# Patient Record
Sex: Female | Born: 1940 | Race: Black or African American | Hispanic: No | Marital: Married | Smoking: Former smoker
Health system: Southern US, Community
[De-identification: ages and names within clinical notes are randomized; demographics above are authoritative.]

## PROBLEM LIST (undated history)

## (undated) DIAGNOSIS — N1831 Chronic kidney disease, stage 3a: Secondary | ICD-10-CM

## (undated) DIAGNOSIS — I4891 Unspecified atrial fibrillation: Secondary | ICD-10-CM

## (undated) DIAGNOSIS — Z972 Presence of dental prosthetic device (complete) (partial): Secondary | ICD-10-CM

## (undated) DIAGNOSIS — I471 Supraventricular tachycardia, unspecified: Secondary | ICD-10-CM

## (undated) DIAGNOSIS — I340 Nonrheumatic mitral (valve) insufficiency: Secondary | ICD-10-CM

## (undated) DIAGNOSIS — M1990 Unspecified osteoarthritis, unspecified site: Secondary | ICD-10-CM

## (undated) DIAGNOSIS — K219 Gastro-esophageal reflux disease without esophagitis: Secondary | ICD-10-CM

## (undated) DIAGNOSIS — I739 Peripheral vascular disease, unspecified: Secondary | ICD-10-CM

## (undated) DIAGNOSIS — I10 Essential (primary) hypertension: Secondary | ICD-10-CM

## (undated) DIAGNOSIS — R011 Cardiac murmur, unspecified: Secondary | ICD-10-CM

## (undated) DIAGNOSIS — M8580 Other specified disorders of bone density and structure, unspecified site: Secondary | ICD-10-CM

## (undated) DIAGNOSIS — H409 Unspecified glaucoma: Secondary | ICD-10-CM

## (undated) HISTORY — PX: CARDIAC CATHETERIZATION: SHX172

---

## 2004-09-30 ENCOUNTER — Ambulatory Visit: Payer: Self-pay | Admitting: Family Medicine

## 2004-10-14 ENCOUNTER — Ambulatory Visit: Payer: Self-pay | Admitting: Internal Medicine

## 2005-10-27 ENCOUNTER — Ambulatory Visit: Payer: Self-pay | Admitting: General Practice

## 2006-01-14 ENCOUNTER — Ambulatory Visit: Payer: Self-pay | Admitting: Family Medicine

## 2006-03-06 ENCOUNTER — Other Ambulatory Visit: Payer: Self-pay

## 2006-03-06 ENCOUNTER — Emergency Department: Payer: Self-pay | Admitting: Emergency Medicine

## 2006-03-30 ENCOUNTER — Ambulatory Visit: Payer: Self-pay | Admitting: Family Medicine

## 2007-05-03 ENCOUNTER — Ambulatory Visit: Payer: Self-pay | Admitting: Family Medicine

## 2007-07-08 ENCOUNTER — Ambulatory Visit: Payer: Self-pay | Admitting: Family Medicine

## 2007-11-15 ENCOUNTER — Ambulatory Visit: Payer: Self-pay | Admitting: Gastroenterology

## 2008-09-17 ENCOUNTER — Ambulatory Visit: Payer: Self-pay | Admitting: Internal Medicine

## 2008-10-10 ENCOUNTER — Ambulatory Visit: Payer: Self-pay | Admitting: Internal Medicine

## 2009-02-20 ENCOUNTER — Ambulatory Visit: Payer: Self-pay | Admitting: Family Medicine

## 2009-05-05 ENCOUNTER — Ambulatory Visit: Payer: Self-pay

## 2009-05-12 ENCOUNTER — Ambulatory Visit: Payer: Self-pay | Admitting: Pain Medicine

## 2009-05-27 ENCOUNTER — Ambulatory Visit: Payer: Self-pay | Admitting: Pain Medicine

## 2009-06-10 ENCOUNTER — Ambulatory Visit: Payer: Self-pay | Admitting: Physician Assistant

## 2009-06-24 ENCOUNTER — Ambulatory Visit: Payer: Self-pay | Admitting: Pain Medicine

## 2010-09-02 ENCOUNTER — Encounter: Admission: RE | Admit: 2010-09-02 | Discharge: 2010-09-02 | Payer: Self-pay | Admitting: Neurosurgery

## 2010-10-28 ENCOUNTER — Ambulatory Visit: Payer: Self-pay | Admitting: Family Medicine

## 2011-12-15 ENCOUNTER — Ambulatory Visit: Payer: Self-pay | Admitting: Gastroenterology

## 2012-06-07 ENCOUNTER — Telehealth: Payer: Self-pay

## 2012-06-07 ENCOUNTER — Other Ambulatory Visit: Payer: Self-pay

## 2012-06-07 DIAGNOSIS — R109 Unspecified abdominal pain: Secondary | ICD-10-CM

## 2012-06-07 NOTE — Telephone Encounter (Signed)
Pt has been scheduled for an EUS and needs to be instructed and meds reviewed

## 2012-06-07 NOTE — Telephone Encounter (Signed)
Pt has been instructed and meds reviewed pt will call with any questions or concerns 

## 2012-06-21 ENCOUNTER — Telehealth: Payer: Self-pay

## 2012-06-21 NOTE — Telephone Encounter (Signed)
Pt has been reinstructed and meds reviewed for new appt date and time

## 2012-06-21 NOTE — Telephone Encounter (Signed)
eus changed to 07/13/12 1030 am

## 2012-07-12 DIAGNOSIS — G8929 Other chronic pain: Secondary | ICD-10-CM | POA: Insufficient documentation

## 2012-07-12 DIAGNOSIS — K219 Gastro-esophageal reflux disease without esophagitis: Secondary | ICD-10-CM | POA: Insufficient documentation

## 2012-07-12 DIAGNOSIS — I10 Essential (primary) hypertension: Secondary | ICD-10-CM | POA: Insufficient documentation

## 2012-07-12 DIAGNOSIS — D649 Anemia, unspecified: Secondary | ICD-10-CM | POA: Insufficient documentation

## 2012-07-13 ENCOUNTER — Encounter (HOSPITAL_COMMUNITY): Payer: Self-pay

## 2012-07-13 ENCOUNTER — Ambulatory Visit (HOSPITAL_COMMUNITY): Payer: Medicare Other | Admitting: Anesthesiology

## 2012-07-13 ENCOUNTER — Encounter (HOSPITAL_COMMUNITY): Payer: Self-pay | Admitting: Anesthesiology

## 2012-07-13 ENCOUNTER — Encounter (HOSPITAL_COMMUNITY)
Admission: RE | Disposition: A | Discharge status: Home / Self Care | Payer: Self-pay | Source: Ambulatory Visit | Attending: Gastroenterology

## 2012-07-13 ENCOUNTER — Ambulatory Visit (HOSPITAL_COMMUNITY)
Admission: RE | Admit: 2012-07-13 | Discharge: 2012-07-13 | Disposition: A | Discharge status: Home / Self Care | Payer: Medicare Other | Source: Ambulatory Visit | Attending: Gastroenterology | Admitting: Gastroenterology

## 2012-07-13 DIAGNOSIS — K8689 Other specified diseases of pancreas: Secondary | ICD-10-CM | POA: Insufficient documentation

## 2012-07-13 DIAGNOSIS — R109 Unspecified abdominal pain: Secondary | ICD-10-CM

## 2012-07-13 DIAGNOSIS — K219 Gastro-esophageal reflux disease without esophagitis: Secondary | ICD-10-CM | POA: Insufficient documentation

## 2012-07-13 DIAGNOSIS — I10 Essential (primary) hypertension: Secondary | ICD-10-CM | POA: Insufficient documentation

## 2012-07-13 DIAGNOSIS — D649 Anemia, unspecified: Secondary | ICD-10-CM | POA: Insufficient documentation

## 2012-07-13 HISTORY — DX: Essential (primary) hypertension: I10

## 2012-07-13 HISTORY — PX: EUS: SHX5427

## 2012-07-13 SURGERY — UPPER ENDOSCOPIC ULTRASOUND (EUS) LINEAR
Anesthesia: Monitor Anesthesia Care

## 2012-07-13 MED ORDER — LACTATED RINGERS IV SOLN
INTRAVENOUS | Status: DC
  Administered 2012-07-13: 10:00:00 via INTRAVENOUS

## 2012-07-13 MED ORDER — FENTANYL CITRATE 0.05 MG/ML IJ SOLN
INTRAMUSCULAR | Status: DC | PRN
  Administered 2012-07-13: 50 ug via INTRAVENOUS

## 2012-07-13 MED ORDER — PROPOFOL 10 MG/ML IV EMUL
INTRAVENOUS | Status: DC | PRN
  Administered 2012-07-13: 140 ug/kg/min via INTRAVENOUS

## 2012-07-13 MED ORDER — MIDAZOLAM HCL 5 MG/5ML IJ SOLN
INTRAMUSCULAR | Status: DC | PRN
  Administered 2012-07-13 (×2): 1 mg via INTRAVENOUS

## 2012-07-13 MED ORDER — SODIUM CHLORIDE 0.9 % IV SOLN: INTRAVENOUS | Status: DC

## 2012-07-13 MED ORDER — KETAMINE HCL 10 MG/ML IJ SOLN
INTRAMUSCULAR | Status: DC | PRN
  Administered 2012-07-13: 16 mg via INTRAVENOUS

## 2012-07-13 MED ORDER — ONDANSETRON HCL 4 MG/2ML IJ SOLN
INTRAMUSCULAR | Status: DC | PRN
  Administered 2012-07-13: 4 mg via INTRAVENOUS

## 2012-07-13 MED ORDER — BUTAMBEN-TETRACAINE-BENZOCAINE 2-2-14 % EX AERO
INHALATION_SPRAY | CUTANEOUS | Status: DC | PRN
  Administered 2012-07-13: 2 via TOPICAL

## 2012-07-13 NOTE — Transfer of Care (Signed)
Immediate Anesthesia Transfer of Care Note  Patient: Margaret Cannon  Procedure(s) Performed: Procedure(s) (LRB): UPPER ENDOSCOPIC ULTRASOUND (EUS) LINEAR (N/A)  Patient Location: PACU  Anesthesia Type: MAC  Level of Consciousness: sedated, patient cooperative and responds to stimulaton  Airway & Oxygen Therapy: Patient Spontanous Breathing and Patient connected to face mask oxgen  Post-op Assessment: Report given to PACU RN and Post -op Vital signs reviewed and stable  Post vital signs: Reviewed and stable  Complications: No apparent anesthesia complications

## 2012-07-13 NOTE — Preoperative (Signed)
Beta Blockers   Reason not to administer Beta Blockers:Not Applicable 

## 2012-07-13 NOTE — Anesthesia Postprocedure Evaluation (Signed)
Anesthesia Post Note  Patient: Margaret Cannon  Procedure(s) Performed: Procedure(s) (LRB): UPPER ENDOSCOPIC ULTRASOUND (EUS) LINEAR (N/A)  Anesthesia type: MAC  Patient location: PACU  Post pain: Pain level controlled  Post assessment: Post-op Vital signs reviewed  Last Vitals: BP 122/70  Pulse 82  Temp 36.8 C (Oral)  Resp 23  Ht 5\' 3"  (1.6 m)  Wt 211 lb (95.709 kg)  BMI 37.38 kg/m2  SpO2 99%  Post vital signs: Reviewed  Level of consciousness: awake  Complications: No apparent anesthesia complications

## 2012-07-13 NOTE — Op Note (Signed)
[facility] [address] [place], [address]   ENDOSCOPIC ULTRASOUND PROCEDURE REPORT  PATIENT: Margaret Cannon, Margaret Cannon  MR#: [id number] BIRTHDATE: 1941/10/01  GENDER: Female ENDOSCOPIST: Rachael Fee, MD REFERRED BY:  Lutricia Feil, MD at [facility] PROCEDURE DATE:  07/13/2012 PROCEDURE:   Upper EUS ASA CLASS:      Class III INDICATIONS:   abdominal pain; CT scan recently without clear etiology.  EGD wtih Dr.  Bluford Kaufmann 4-5 months ago + H.  pylori, treated; never had pancreatic illness, stable weight, no etoh abuse, no pancreatic disease in family. MEDICATIONS: MAC sedation, administered by CRNA  DESCRIPTION OF PROCEDURE:   After the risks benefits and alternatives of the procedure were  explained, informed consent was obtained. The patient was then placed in the left, lateral, decubitus postion and IV sedation was administered. Throughout the procedure, the patients blood pressure, pulse and oxygen saturations were monitored continuously.  Under direct visualization, the Pentax EUS Radial [id number]  endoscope was introduced through the mouth  and advanced to the second portion of the duodenum .  Water was used as necessary to provide an acoustic interface.  Upon completion of the imaging, water was removed and the patient was sent to the recovery room in satisfactory condition.   Endoscopic findings: 1. Normal UGI tract  EUS findings: 1. Pancreatic parenchyma was atrophic but otherwise normal; there were no discrete masses and no signs of chronic pancreatitis 2. CBD was normal, non-dilated and no stones 3. Main pancreatic duct was normal; non-dilated 4. Gallbladder was normal 5. No peripancreatic adenopathy 6. Limited views of liver, spleen, portal and splenic vessels were all normal  Impression: Atrophic pancreas but otherwise the examination was normal.    _______________________________ eSigned:  Rachael Fee, MD 07/13/2012 11:30 AM

## 2012-07-13 NOTE — Anesthesia Preprocedure Evaluation (Addendum)
Anesthesia Evaluation  Patient identified by MRN, date of birth, ID band Patient awake    Reviewed: Allergy & Precautions, H&P , NPO status , Patient's Chart, lab work & pertinent test results  Airway Mallampati: II TM Distance: >3 FB Neck ROM: Full    Dental  (+) Dental Advisory Given, Edentulous Upper and Edentulous Lower   Pulmonary former smoker,  breath sounds clear to auscultation  Pulmonary exam normal       Cardiovascular hypertension, Pt. on medications - CAD, - Past MI and - CHF Rhythm:Regular Rate:Normal     Neuro/Psych negative neurological ROS  negative psych ROS   GI/Hepatic Neg liver ROS, GERD-  Medicated,  Endo/Other  negative endocrine ROS  Renal/GU Renal disease     Musculoskeletal negative musculoskeletal ROS (+)   Abdominal   Peds  Hematology negative hematology ROS (+) Blood dyscrasia, anemia ,   Anesthesia Other Findings   Reproductive/Obstetrics                         Anesthesia Physical Anesthesia Plan  ASA: II  Anesthesia Plan: MAC   Post-op Pain Management:    Induction: Intravenous  Airway Management Planned: Simple Face Mask  Additional Equipment:   Intra-op Plan:   Post-operative Plan:   Informed Consent: I have reviewed the patients History and Physical, chart, labs and discussed the procedure including the risks, benefits and alternatives for the proposed anesthesia with the patient or authorized representative who has indicated his/her understanding and acceptance.   Dental advisory given  Plan Discussed with: CRNA  Anesthesia Plan Comments:        Anesthesia Quick Evaluation

## 2012-07-13 NOTE — H&P (Signed)
  HPI: This is a woman with chronic abd pain (normal LFTs, essentially normal CT) sent here for evaluation of pancreas    Past Medical History  Diagnosis Date  . Hypertension     History reviewed. No pertinent past surgical history.  Current Facility-Administered Medications  Medication Dose Route Frequency Provider Last Rate Last Dose  . 0.9 %  sodium chloride infusion   Intravenous Continuous Rachael Fee, MD      . lactated ringers infusion   Intravenous Continuous Rachael Fee, MD 20 mL/hr at 07/13/12 1610      Allergies as of 06/07/2012  . (No Known Allergies)    History reviewed. No pertinent family history.  History   Social History  . Marital Status: Married    Spouse Name: N/A    Number of Children: N/A  . Years of Education: N/A   Occupational History  . Not on file.   Social History Main Topics  . Smoking status: Former Smoker    Quit date: 07/13/2009  . Smokeless tobacco: Not on file  . Alcohol Use: No  . Drug Use: No  . Sexually Active:    Other Topics Concern  . Not on file   Social History Narrative  . No narrative on file      Physical Exam: BP 141/80  Pulse 82  Temp 98.2 F (36.8 C) (Oral)  Resp 20  Ht 5\' 3"  (1.6 m)  Wt 211 lb (95.709 kg)  BMI 37.38 kg/m2  SpO2 98% Constitutional: generally well-appearing Psychiatric: alert and oriented x3 Abdomen: soft, nontender, nondistended, no obvious ascites, no peritoneal signs, normal bowel sounds     Assessment and plan: 71 y.o. female with abdomnainl pain  For EUS today

## 2012-07-17 ENCOUNTER — Encounter (HOSPITAL_COMMUNITY): Payer: Self-pay | Admitting: Gastroenterology

## 2012-08-29 ENCOUNTER — Ambulatory Visit: Payer: Self-pay | Admitting: Family Medicine

## 2013-10-15 ENCOUNTER — Ambulatory Visit: Payer: Self-pay | Admitting: Family Medicine

## 2014-01-11 DIAGNOSIS — H40002 Preglaucoma, unspecified, left eye: Secondary | ICD-10-CM | POA: Insufficient documentation

## 2014-01-13 DIAGNOSIS — B360 Pityriasis versicolor: Secondary | ICD-10-CM | POA: Insufficient documentation

## 2014-07-30 ENCOUNTER — Ambulatory Visit: Payer: Self-pay | Admitting: Family Medicine

## 2014-08-13 DIAGNOSIS — M8580 Other specified disorders of bone density and structure, unspecified site: Secondary | ICD-10-CM | POA: Insufficient documentation

## 2014-10-18 ENCOUNTER — Ambulatory Visit: Payer: Self-pay | Admitting: Family Medicine

## 2016-06-29 DIAGNOSIS — I87009 Postthrombotic syndrome without complications of unspecified extremity: Secondary | ICD-10-CM | POA: Insufficient documentation

## 2016-06-29 DIAGNOSIS — I878 Other specified disorders of veins: Secondary | ICD-10-CM | POA: Insufficient documentation

## 2016-07-30 ENCOUNTER — Other Ambulatory Visit: Payer: Self-pay | Admitting: Family Medicine

## 2016-07-30 DIAGNOSIS — Z1231 Encounter for screening mammogram for malignant neoplasm of breast: Secondary | ICD-10-CM

## 2016-09-03 ENCOUNTER — Ambulatory Visit
Admission: RE | Admit: 2016-09-03 | Discharge: 2016-09-03 | Disposition: A | Discharge status: Home / Self Care | Payer: Medicare Other | Source: Ambulatory Visit | Attending: Family Medicine | Admitting: Family Medicine

## 2016-09-03 DIAGNOSIS — Z1231 Encounter for screening mammogram for malignant neoplasm of breast: Secondary | ICD-10-CM | POA: Diagnosis not present

## 2017-08-29 ENCOUNTER — Other Ambulatory Visit: Payer: Self-pay | Admitting: Family Medicine

## 2017-08-29 DIAGNOSIS — Z1231 Encounter for screening mammogram for malignant neoplasm of breast: Secondary | ICD-10-CM

## 2017-09-16 ENCOUNTER — Ambulatory Visit
Admission: RE | Admit: 2017-09-16 | Discharge: 2017-09-16 | Disposition: A | Discharge status: Home / Self Care | Payer: Medicare Other | Source: Ambulatory Visit | Attending: Family Medicine | Admitting: Family Medicine

## 2017-09-16 DIAGNOSIS — Z1231 Encounter for screening mammogram for malignant neoplasm of breast: Secondary | ICD-10-CM | POA: Diagnosis present

## 2018-07-04 ENCOUNTER — Encounter: Payer: Self-pay | Admitting: *Deleted

## 2018-07-05 ENCOUNTER — Ambulatory Visit
Admission: RE | Admit: 2018-07-05 | Discharge: 2018-07-05 | Disposition: A | Discharge status: Home / Self Care | Payer: Medicare Other | Source: Ambulatory Visit | Attending: Internal Medicine | Admitting: Internal Medicine

## 2018-07-05 ENCOUNTER — Ambulatory Visit: Payer: Medicare Other | Admitting: Anesthesiology

## 2018-07-05 ENCOUNTER — Other Ambulatory Visit: Payer: Self-pay

## 2018-07-05 ENCOUNTER — Encounter
Admission: RE | Disposition: A | Discharge status: Home / Self Care | Payer: Self-pay | Source: Ambulatory Visit | Attending: Internal Medicine

## 2018-07-05 DIAGNOSIS — K5730 Diverticulosis of large intestine without perforation or abscess without bleeding: Secondary | ICD-10-CM | POA: Insufficient documentation

## 2018-07-05 DIAGNOSIS — H409 Unspecified glaucoma: Secondary | ICD-10-CM | POA: Insufficient documentation

## 2018-07-05 DIAGNOSIS — D123 Benign neoplasm of transverse colon: Secondary | ICD-10-CM | POA: Diagnosis not present

## 2018-07-05 DIAGNOSIS — K449 Diaphragmatic hernia without obstruction or gangrene: Secondary | ICD-10-CM | POA: Insufficient documentation

## 2018-07-05 DIAGNOSIS — M8580 Other specified disorders of bone density and structure, unspecified site: Secondary | ICD-10-CM | POA: Insufficient documentation

## 2018-07-05 DIAGNOSIS — K228 Other specified diseases of esophagus: Secondary | ICD-10-CM | POA: Insufficient documentation

## 2018-07-05 DIAGNOSIS — K640 First degree hemorrhoids: Secondary | ICD-10-CM | POA: Insufficient documentation

## 2018-07-05 DIAGNOSIS — K219 Gastro-esophageal reflux disease without esophagitis: Secondary | ICD-10-CM | POA: Insufficient documentation

## 2018-07-05 DIAGNOSIS — Z86010 Personal history of colonic polyps: Secondary | ICD-10-CM | POA: Insufficient documentation

## 2018-07-05 DIAGNOSIS — Z1211 Encounter for screening for malignant neoplasm of colon: Secondary | ICD-10-CM | POA: Diagnosis not present

## 2018-07-05 DIAGNOSIS — Z87891 Personal history of nicotine dependence: Secondary | ICD-10-CM | POA: Diagnosis not present

## 2018-07-05 DIAGNOSIS — I10 Essential (primary) hypertension: Secondary | ICD-10-CM | POA: Insufficient documentation

## 2018-07-05 HISTORY — DX: Other specified disorders of bone density and structure, unspecified site: M85.80

## 2018-07-05 HISTORY — DX: Gastro-esophageal reflux disease without esophagitis: K21.9

## 2018-07-05 HISTORY — PX: ESOPHAGOGASTRODUODENOSCOPY (EGD) WITH PROPOFOL: SHX5813

## 2018-07-05 HISTORY — PX: COLONOSCOPY WITH PROPOFOL: SHX5780

## 2018-07-05 HISTORY — DX: Unspecified glaucoma: H40.9

## 2018-07-05 HISTORY — DX: Cardiac murmur, unspecified: R01.1

## 2018-07-05 SURGERY — COLONOSCOPY WITH PROPOFOL
Anesthesia: General

## 2018-07-05 MED ORDER — PROPOFOL 500 MG/50ML IV EMUL
INTRAVENOUS | Status: AC
  Filled 2018-07-05: qty 50

## 2018-07-05 MED ORDER — PROPOFOL 10 MG/ML IV BOLUS
INTRAVENOUS | Status: DC | PRN
  Administered 2018-07-05: 100 mg via INTRAVENOUS

## 2018-07-05 MED ORDER — LIDOCAINE HCL (CARDIAC) PF 100 MG/5ML IV SOSY
PREFILLED_SYRINGE | INTRAVENOUS | Status: DC | PRN
  Administered 2018-07-05: 100 mg via INTRAVENOUS

## 2018-07-05 MED ORDER — PROPOFOL 500 MG/50ML IV EMUL
INTRAVENOUS | Status: DC | PRN
  Administered 2018-07-05: 150 ug/kg/min via INTRAVENOUS

## 2018-07-05 MED ORDER — GLYCOPYRROLATE 0.2 MG/ML IJ SOLN
INTRAMUSCULAR | Status: AC
  Filled 2018-07-05: qty 1

## 2018-07-05 MED ORDER — GLYCOPYRROLATE 0.2 MG/ML IJ SOLN
INTRAMUSCULAR | Status: DC | PRN
  Administered 2018-07-05: 0.2 mg via INTRAVENOUS

## 2018-07-05 MED ORDER — SODIUM CHLORIDE 0.9 % IV SOLN
INTRAVENOUS | Status: DC
  Administered 2018-07-05: 08:00:00 via INTRAVENOUS

## 2018-07-05 MED ORDER — LIDOCAINE HCL (PF) 2 % IJ SOLN
INTRAMUSCULAR | Status: AC
  Filled 2018-07-05: qty 10

## 2018-07-05 NOTE — Anesthesia Preprocedure Evaluation (Signed)
Anesthesia Evaluation  Patient identified by MRN, date of birth, ID band Patient awake    Reviewed: Allergy & Precautions, H&P , NPO status , Patient's Chart, lab work & pertinent test results, reviewed documented beta blocker date and time   History of Anesthesia Complications Negative for: history of anesthetic complications  Airway Mallampati: II  TM Distance: >3 FB Neck ROM: full    Dental  (+) Lower Dentures, Upper Dentures, Edentulous Upper, Edentulous Lower, Dental Advidsory Given   Pulmonary neg pulmonary ROS, former smoker,           Cardiovascular Exercise Tolerance: Good hypertension, (-) angina(-) CAD, (-) Past MI, (-) Cardiac Stents and (-) CABG (-) dysrhythmias + Valvular Problems/Murmurs      Neuro/Psych negative neurological ROS  negative psych ROS   GI/Hepatic negative GI ROS, Neg liver ROS,   Endo/Other  negative endocrine ROS  Renal/GU negative Renal ROS  negative genitourinary   Musculoskeletal   Abdominal   Peds  Hematology negative hematology ROS (+)   Anesthesia Other Findings Past Medical History: No date: GERD (gastroesophageal reflux disease) No date: Glaucoma     Comment:  left eye No date: Heart murmur No date: Hypertension No date: Osteopenia   Reproductive/Obstetrics negative OB ROS                             Anesthesia Physical Anesthesia Plan  ASA: II  Anesthesia Plan: General   Post-op Pain Management:    Induction: Intravenous  PONV Risk Score and Plan: 3 and Propofol infusion and TIVA  Airway Management Planned: Natural Airway and Nasal Cannula  Additional Equipment:   Intra-op Plan:   Post-operative Plan:   Informed Consent: I have reviewed the patients History and Physical, chart, labs and discussed the procedure including the risks, benefits and alternatives for the proposed anesthesia with the patient or authorized representative  who has indicated his/her understanding and acceptance.   Dental Advisory Given  Plan Discussed with: Anesthesiologist, CRNA and Surgeon  Anesthesia Plan Comments:         Anesthesia Quick Evaluation

## 2018-07-05 NOTE — H&P (Signed)
Outpatient short stay form Pre-procedure 07/05/2018 8:16 AM Margaret Cannon K. Margaret Cannon, M.D.  Primary Physician: Margaret Holmes, MD  Reason for visit: Dysphagia, personal history of colon polyps, GERD  History of present illness: Pleasant 77 year old female presents for intermittent dysphagia over the past several months with solid food dysphagia to the level sternal notch.  Has acid reflux that has only minimally responded to over-the-counter antacids.  Denies any rectal bleeding change in bowel habits or abdominal pain.    Current Facility-Administered Medications:  .  0.9 %  sodium chloride infusion, , Intravenous, Continuous, Margaret Cannon, Margaret Pike, MD  Medications Prior to Admission  Medication Sig Dispense Refill Last Dose  . HYDROcodone-acetaminophen (NORCO/VICODIN) 5-325 MG per tablet Take 1 tablet by mouth every 6 (six) hours as needed.   Past Week at Unknown time  . pantoprazole (PROTONIX) 40 MG tablet Take 40 mg by mouth daily.   Past Week at Unknown time  . triamterene-hydrochlorothiazide (DYAZIDE) 37.5-25 MG per capsule Take 1 capsule by mouth every morning.   07/05/2018 at 0600     No Known Allergies   Past Medical History:  Diagnosis Date  . GERD (gastroesophageal reflux disease)   . Glaucoma    left eye  . Heart murmur   . Hypertension   . Osteopenia     Review of systems:  Otherwise negative.    Physical Exam  Gen: Alert, oriented. Appears stated age.  HEENT: [place]/AT. PERRLA. Lungs: CTA, no wheezes. CV: RR nl S1, S2. Abd: soft, benign, no masses. BS+ Ext: No edema. Pulses 2+    Planned procedures: Proceed with EGD and colonoscopy. The patient understands the nature of the planned procedure, indications, risks, alternatives and potential complications including but not limited to bleeding, infection, perforation, damage to internal organs and possible oversedation/side effects from anesthesia. The patient agrees and gives consent to proceed.  Please refer to procedure  notes for findings, recommendations and patient disposition/instructions.     Margaret Cannon K. Margaret Cannon, M.D. Gastroenterology 07/05/2018  8:16 AM

## 2018-07-05 NOTE — Transfer of Care (Signed)
Immediate Anesthesia Transfer of Care Note  Patient: Margaret Cannon  Procedure(s) Performed: COLONOSCOPY WITH PROPOFOL (N/A ) ESOPHAGOGASTRODUODENOSCOPY (EGD) WITH PROPOFOL (N/A )  Patient Location: Endoscopy Unit  Anesthesia Type:General  Level of Consciousness: sedated  Airway & Oxygen Therapy: Patient Spontanous Breathing and Patient connected to nasal cannula oxygen  Post-op Assessment: Report given to RN and Post -op Vital signs reviewed and stable  Post vital signs: Reviewed and stable  Last Vitals:  Vitals Value Taken Time  BP 102/53 07/05/2018  8:44 AM  Temp 36.1 C 07/05/2018  8:44 AM  Pulse 91 07/05/2018  8:46 AM  Resp 22 07/05/2018  8:46 AM  SpO2 100 % 07/05/2018  8:46 AM  Vitals shown include unvalidated device data.  Last Pain:  Vitals:   07/05/18 0844  TempSrc: Tympanic  PainSc:          Complications: No apparent anesthesia complications

## 2018-07-05 NOTE — Op Note (Signed)
[facility] Gastroenterology Patient Name: Margaret Cannon Procedure Date: 07/05/2018 7:30 AM MRN: [id number] Account #: [id number] Date of Birth: 1941/09/04 Admit Type: Outpatient Age: 77 Room: [facility] ENDO ROOM 4 Gender: Female Note Status: Finalized Procedure:            Upper GI endoscopy Indications:          Dysphagia, Follow-up of esophageal reflux Providers:            Benay Pike. Alice Reichert MD, MD Referring MD:         Rubbie Battiest. Iona Beard MD, MD (Referring MD) Medicines:            Propofol per Anesthesia Complications:        No immediate complications. Procedure:            Pre-Anesthesia Assessment:                       - The risks and benefits of the procedure and the                        sedation options and risks were discussed with the                        patient. All questions were answered and informed                        consent was obtained.                       - Patient identification and proposed procedure were                        verified prior to the procedure by the nurse. The                        procedure was verified in the procedure room.                       - ASA Grade Assessment: III - A patient with severe                        systemic disease.                       - After reviewing the risks and benefits, the patient                        was deemed in satisfactory condition to undergo the                        procedure.                       After obtaining informed consent, the endoscope was                        passed under direct vision. Throughout the procedure,                        the patient's blood pressure, pulse, and oxygen  saturations were monitored continuously. The Endoscope                        was introduced through the mouth, and advanced to the                        third part of duodenum. The upper GI endoscopy was                        accomplished without difficulty.  The patient tolerated                        the procedure well. Findings:      Moderate tortuosity of the mid to distal esophagus was noted compatible       with a diagnosis of Presbyesophagus.      A 5 cm hiatal hernia was present.      The examined duodenum was normal.      The exam was otherwise without abnormality. Impression:           - 5 cm hiatal hernia.                       - Normal examined duodenum.                       - The examination was otherwise normal.                       - No specimens collected. Recommendation:       - Patient has a contact number available for                        emergencies. The signs and symptoms of potential                        delayed complications were discussed with the patient.                        Return to normal activities tomorrow. Written discharge                        instructions were provided to the patient.                       - Resume previous diet.                       - Continue present medications. Procedure Code(s):    --- Professional ---                       [phone], Esophagogastroduodenoscopy, flexible, transoral;                        diagnostic, including collection of specimen(s) by                        brushing or washing, when performed (separate procedure) Diagnosis Code(s):    --- Professional ---                       K21.9, Gastro-esophageal reflux disease without  esophagitis                       R13.10, Dysphagia, unspecified                       K44.9, Diaphragmatic hernia without obstruction or                        gangrene CPT copyright 2017 American Medical Association. All rights reserved. The codes documented in this report are preliminary and upon coder review may  be revised to meet current compliance requirements. Efrain Sella MD, MD 07/05/2018 8:29:12 AM This report has been signed electronically. Number of Addenda: 0 Note Initiated On: 07/05/2018 7:30  AM      [facility]

## 2018-07-05 NOTE — Anesthesia Post-op Follow-up Note (Signed)
Anesthesia QCDR form completed.        

## 2018-07-05 NOTE — Interval H&P Note (Signed)
History and Physical Interval Note:  07/05/2018 8:17 AM  Margaret Cannon  has presented today for surgery, with the diagnosis of PHCP GERD  DYSPHAGIA  The various methods of treatment have been discussed with the patient and family. After consideration of risks, benefits and other options for treatment, the patient has consented to  Procedure(s): COLONOSCOPY WITH PROPOFOL (N/A) ESOPHAGOGASTRODUODENOSCOPY (EGD) WITH PROPOFOL (N/A) as a surgical intervention .  The patient's history has been reviewed, patient examined, no change in status, stable for surgery.  I have reviewed the patient's chart and labs.  Questions were answered to the patient's satisfaction.     [place], [place]

## 2018-07-05 NOTE — Anesthesia Postprocedure Evaluation (Signed)
Anesthesia Post Note  Patient: Margaret Cannon  Procedure(s) Performed: COLONOSCOPY WITH PROPOFOL (N/A ) ESOPHAGOGASTRODUODENOSCOPY (EGD) WITH PROPOFOL (N/A )  Patient location during evaluation: Endoscopy Anesthesia Type: General Level of consciousness: awake and alert Pain management: pain level controlled Vital Signs Assessment: post-procedure vital signs reviewed and stable Respiratory status: spontaneous breathing, nonlabored ventilation, respiratory function stable and patient connected to nasal cannula oxygen Cardiovascular status: blood pressure returned to baseline and stable Postop Assessment: no apparent nausea or vomiting Anesthetic complications: no     Last Vitals:  Vitals:   07/05/18 0844 07/05/18 0904  BP: (!) 102/53 (!) 100/50  Pulse:    Resp:    Temp: (!) 36.1 C     Last Pain:  Vitals:   07/05/18 0904  TempSrc:   PainSc: 0-No pain                 Martha Clan

## 2018-07-05 NOTE — Op Note (Signed)
[facility] Gastroenterology Patient Name: Margaret Cannon Procedure Date: 07/05/2018 7:30 AM MRN: [id number] Account #: [id number] Date of Birth: [birth year] Admit Type: Outpatient Age: 77 Room: [facility] ENDO ROOM 4 Gender: Female Note Status: Finalized Procedure:            Colonoscopy Indications:          High risk colon cancer surveillance: Personal history                        of colonic polyps Providers:            Benay Pike. [place] MD, MD Medicines:            Propofol per Anesthesia Complications:        No immediate complications. Procedure:            Pre-Anesthesia Assessment:                       - The risks and benefits of the procedure and the                        sedation options and risks were discussed with the                        patient. All questions were answered and informed                        consent was obtained.                       - Patient identification and proposed procedure were                        verified prior to the procedure by the nurse. The                        procedure was verified in the procedure room.                       - ASA Grade Assessment: III - A patient with severe                        systemic disease.                       - After reviewing the risks and benefits, the patient                        was deemed in satisfactory condition to undergo the                        procedure.                       After obtaining informed consent, the colonoscope was                        passed under direct vision. Throughout the procedure,                        the patient's blood pressure, pulse, and oxygen  saturations were monitored continuously. The                        Colonoscope was introduced through the anus and                        advanced to the the cecum, identified by appendiceal                        orifice and ileocecal valve. The colonoscopy was                performed without difficulty. The patient tolerated the                        procedure well. The quality of the bowel preparation                        was good. The ileocecal valve, appendiceal orifice, and                        rectum were photographed. Findings:      The perianal and digital rectal examinations were normal. Pertinent       negatives include normal sphincter tone and no palpable rectal lesions.      A 5 mm polyp was found in the transverse colon. The polyp was sessile.       The polyp was removed with a hot snare. Resection and retrieval were       complete.      A few small-mouthed diverticula were found in the transverse colon and       left colon.      Non-bleeding internal hemorrhoids were found during retroflexion. The       hemorrhoids were Grade I (internal hemorrhoids that do not prolapse).      The exam was otherwise without abnormality. Impression:           - One 5 mm polyp in the transverse colon, removed with                        a hot snare. Resected and retrieved.                       - Diverticulosis in the transverse colon and in the                        left colon.                       - Non-bleeding internal hemorrhoids.                       - The examination was otherwise normal. Recommendation:       - Patient has a contact number available for                        emergencies. The signs and symptoms of potential                        delayed complications were discussed with the patient.  Return to normal activities tomorrow. Written discharge                        instructions were provided to the patient.                       - Resume previous diet.                       - Continue present medications.                       - Return to physician assistant in 4 weeks.                       - NO repeat colonoscopy due to age/comorbid status                       - The findings and recommendations  were discussed with                        the patient and their family. Procedure Code(s):    --- Professional ---                       [phone], Colonoscopy, flexible; with removal of tumor(s),                        polyp(s), or other lesion(s) by snare technique Diagnosis Code(s):    --- Professional ---                       K57.30, Diverticulosis of large intestine without                        perforation or abscess without bleeding                       K64.0, First degree hemorrhoids                       D12.3, Benign neoplasm of transverse colon (hepatic                        flexure or splenic flexure)                       Z86.010, Personal history of colonic polyps CPT copyright 2017 American Medical Association. All rights reserved. The codes documented in this report are preliminary and upon coder review may  be revised to meet current compliance requirements. Efrain Sella MD, MD 07/05/2018 8:44:37 AM This report has been signed electronically. Number of Addenda: 0 Note Initiated On: 07/05/2018 7:30 AM Scope Withdrawal Time: 0 hours 6 minutes 0 seconds  Total Procedure Duration: 0 hours 10 minutes 12 seconds       [facility]

## 2018-07-06 ENCOUNTER — Encounter: Payer: Self-pay | Admitting: Internal Medicine

## 2018-07-06 LAB — SURGICAL PATHOLOGY

## 2018-08-10 ENCOUNTER — Other Ambulatory Visit: Payer: Self-pay | Admitting: Family Medicine

## 2018-08-10 DIAGNOSIS — Z1231 Encounter for screening mammogram for malignant neoplasm of breast: Secondary | ICD-10-CM

## 2018-09-18 ENCOUNTER — Ambulatory Visit
Admission: RE | Admit: 2018-09-18 | Discharge: 2018-09-18 | Disposition: A | Discharge status: Home / Self Care | Payer: Medicare Other | Source: Ambulatory Visit | Attending: Family Medicine | Admitting: Family Medicine

## 2018-09-18 DIAGNOSIS — Z1231 Encounter for screening mammogram for malignant neoplasm of breast: Secondary | ICD-10-CM | POA: Insufficient documentation

## 2019-08-27 ENCOUNTER — Other Ambulatory Visit: Payer: Self-pay | Admitting: Family Medicine

## 2019-08-27 DIAGNOSIS — Z1231 Encounter for screening mammogram for malignant neoplasm of breast: Secondary | ICD-10-CM

## 2019-09-16 DIAGNOSIS — Z0000 Encounter for general adult medical examination without abnormal findings: Secondary | ICD-10-CM | POA: Insufficient documentation

## 2019-09-20 ENCOUNTER — Ambulatory Visit
Admission: RE | Admit: 2019-09-20 | Discharge: 2019-09-20 | Disposition: A | Discharge status: Home / Self Care | Payer: Medicare Other | Source: Ambulatory Visit | Attending: Family Medicine | Admitting: Family Medicine

## 2019-09-20 DIAGNOSIS — Z1231 Encounter for screening mammogram for malignant neoplasm of breast: Secondary | ICD-10-CM | POA: Diagnosis not present

## 2019-09-28 IMAGING — MG DIGITAL SCREENING BILATERAL MAMMOGRAM WITH TOMO AND CAD
8 series · 8 of 24 positions shown · non-contrast
Comparison: Previous exam(s).

CLINICAL DATA: Screening.

EXAM:
DIGITAL SCREENING BILATERAL MAMMOGRAM WITH TOMO AND CAD

[L MLO synth-2D]
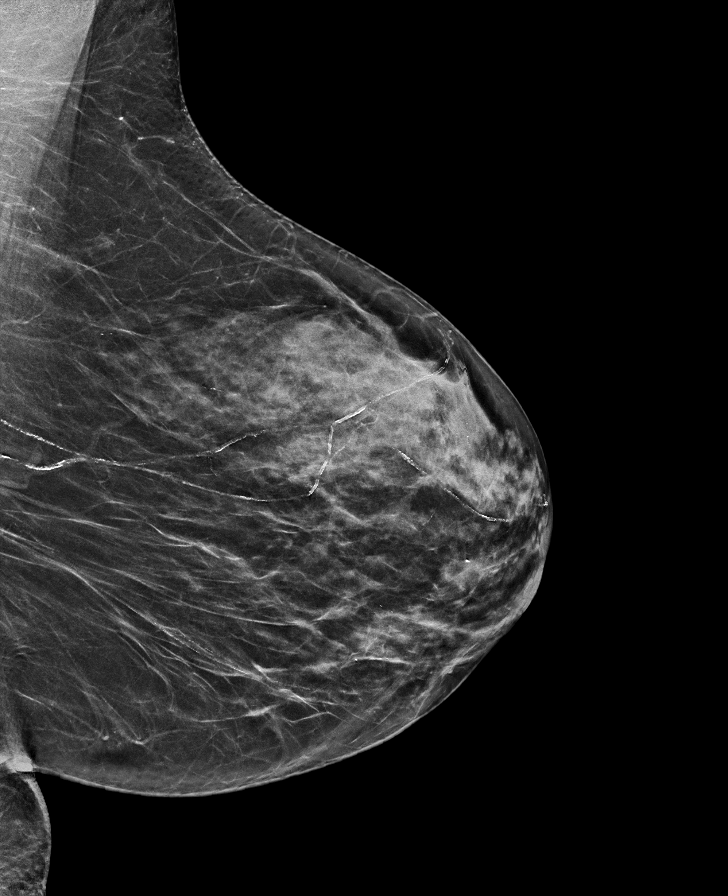

[R MLO synth-2D]
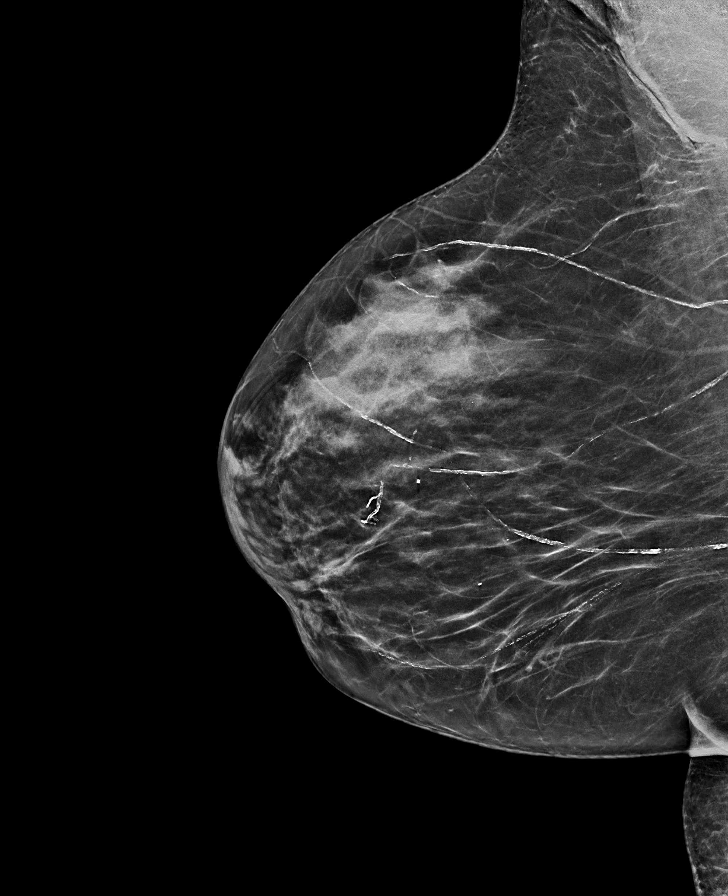

[R CC synth-2D]
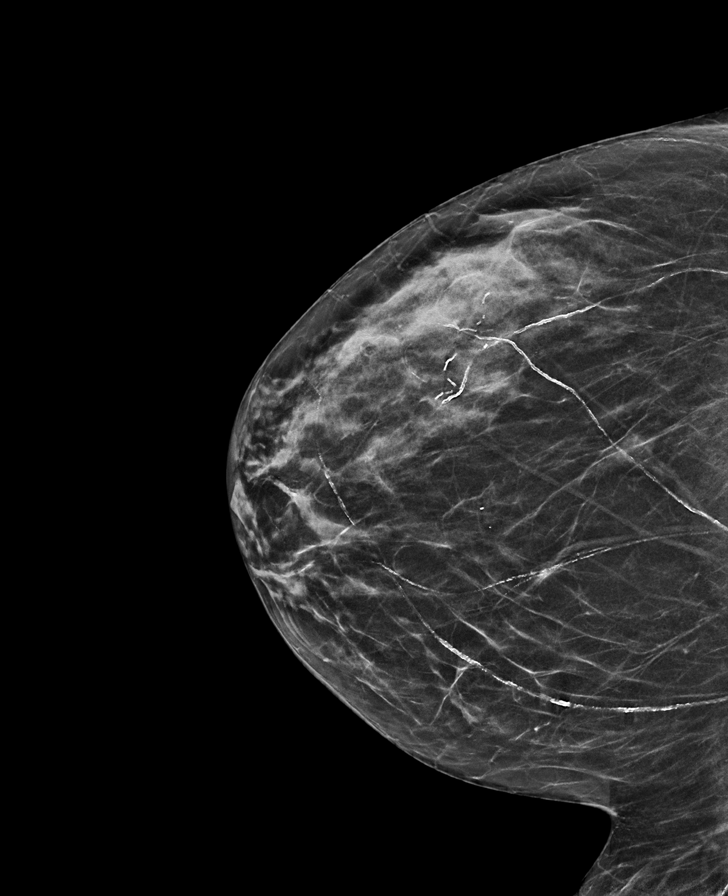

[L CC synth-2D]
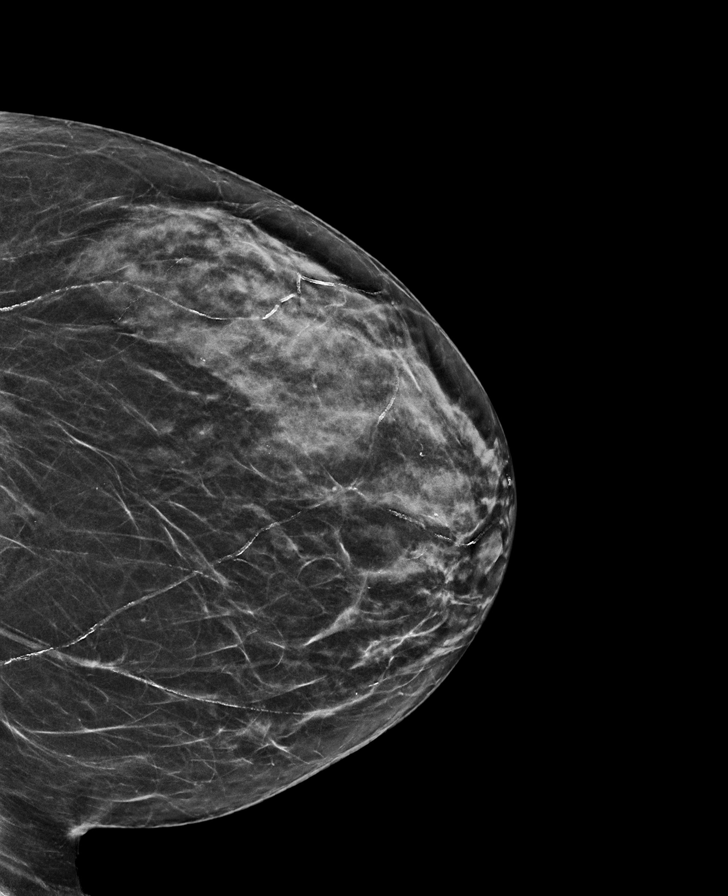

[R MLO tomo · tomo slice 29/56.0]
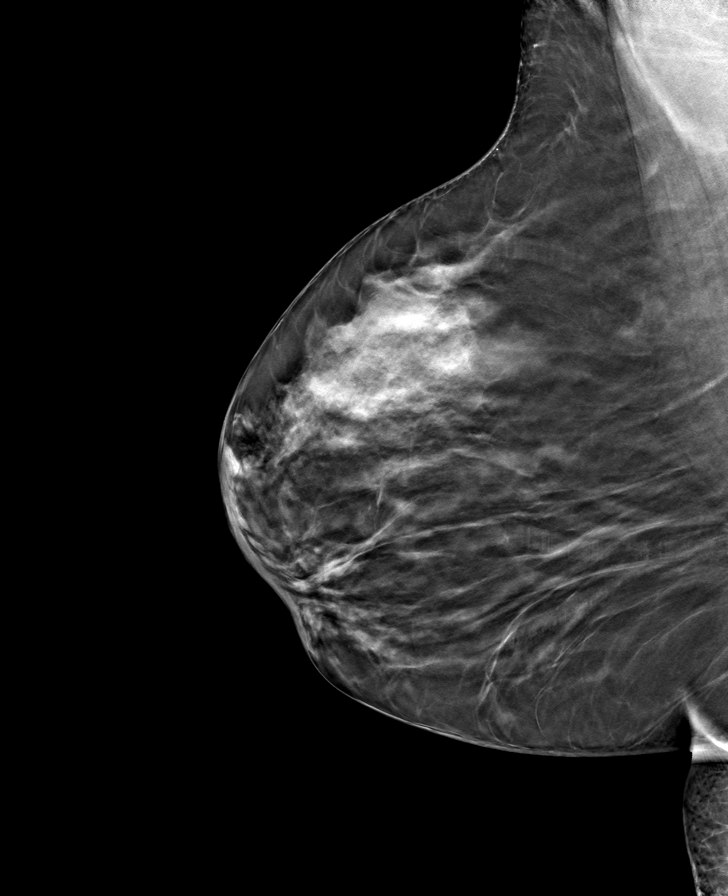

[R CC tomo · tomo slice 27/52.0]
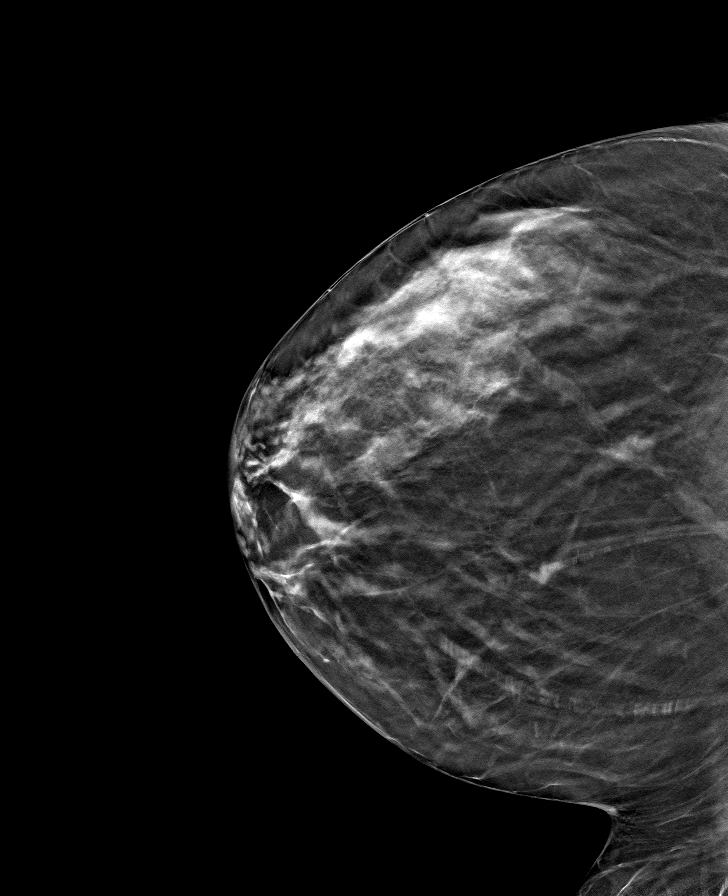

[L MLO tomo · tomo slice 31/60.0]
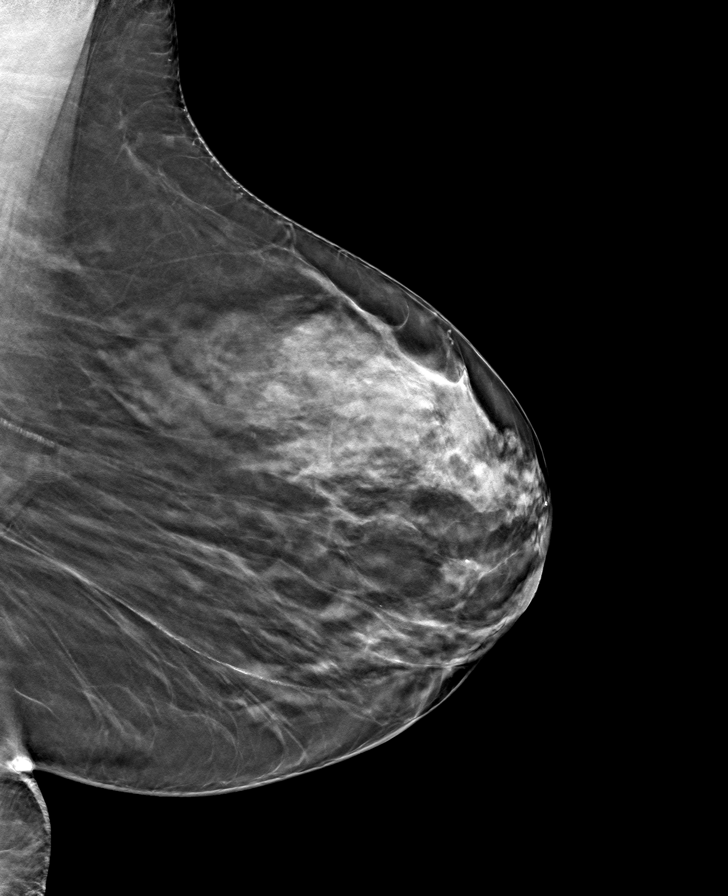

[L CC tomo · tomo slice 27/54.0]
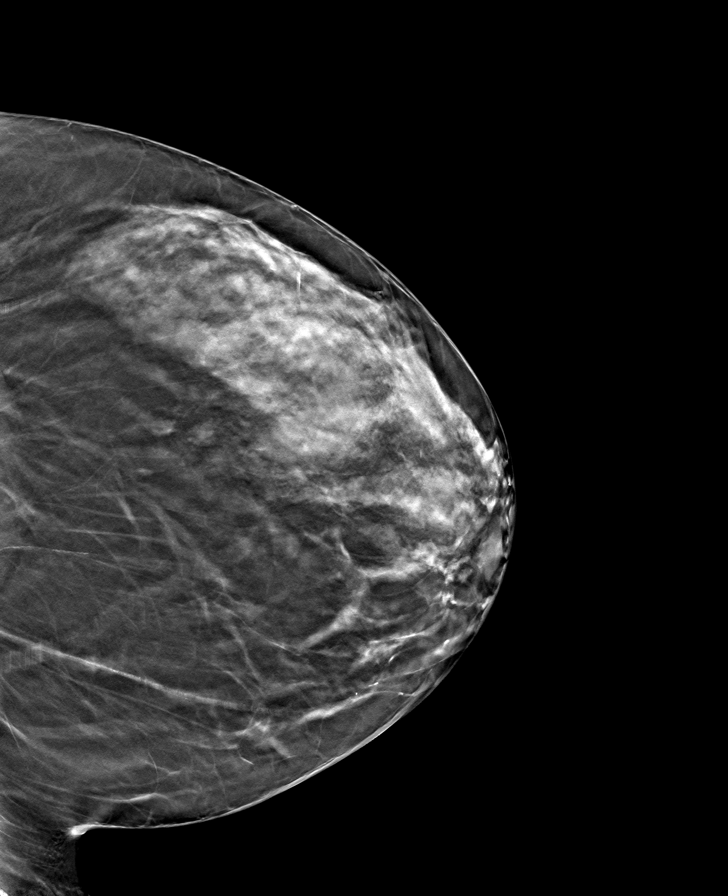

[8 of 24 positions shown; findings below may reference images not displayed]

ACR Breast Density Category c: The breast tissue is heterogeneously
dense, which may obscure small masses.
FINDINGS: There are no findings suspicious for malignancy. Images were
processed with CAD.
IMPRESSION: No mammographic evidence of malignancy. A result letter of this
screening mammogram will be mailed directly to the patient.

RECOMMENDATION:
Screening mammogram in one year. (Code:FT-U-LHB)

BI-RADS CATEGORY  1: Negative.

## 2019-11-05 ENCOUNTER — Other Ambulatory Visit: Payer: Self-pay | Admitting: Family Medicine

## 2019-11-05 DIAGNOSIS — Z780 Asymptomatic menopausal state: Secondary | ICD-10-CM

## 2020-04-16 DIAGNOSIS — L209 Atopic dermatitis, unspecified: Secondary | ICD-10-CM | POA: Insufficient documentation

## 2020-09-03 ENCOUNTER — Other Ambulatory Visit: Payer: Self-pay

## 2020-09-03 ENCOUNTER — Ambulatory Visit: Payer: Medicare Other | Admitting: Podiatry

## 2020-09-03 ENCOUNTER — Encounter: Payer: Self-pay | Admitting: Podiatry

## 2020-09-03 DIAGNOSIS — S90212A Contusion of left great toe with damage to nail, initial encounter: Secondary | ICD-10-CM

## 2020-09-03 DIAGNOSIS — L03032 Cellulitis of left toe: Secondary | ICD-10-CM

## 2020-09-03 DIAGNOSIS — E669 Obesity, unspecified: Secondary | ICD-10-CM | POA: Insufficient documentation

## 2020-09-03 MED ORDER — NEOMYCIN-POLYMYXIN-HC 1 % OT SOLN: OTIC | 1 refills | Status: DC

## 2020-09-03 MED ORDER — DOXYCYCLINE HYCLATE 100 MG PO TABS: 100.0000 mg | ORAL_TABLET | Freq: Two times a day (BID) | ORAL | 0 refills | Status: DC

## 2020-09-03 NOTE — Progress Notes (Signed)
Subjective:  Patient ID: Margaret Cannon, female    DOB: 1941/06/12,  MRN: [id number] HPI Chief Complaint  Patient presents with  . Toe Injury    Hallux left - hit toe on chair x 2 weeks ago, nail is lifted from nailbed, tried trimming it, very sore, wears compression stockings  . New Patient (Initial Visit)    79 y.o. female presents with the above complaint.   ROS: Denies fever chills nausea vomiting muscle aches pains calf pain back pain chest pain shortness of breath.  Past Medical History:  Diagnosis Date  . GERD (gastroesophageal reflux disease)   . Glaucoma    left eye  . Heart murmur   . Hypertension   . Osteopenia    Past Surgical History:  Procedure Laterality Date  . CARDIAC CATHETERIZATION    . COLONOSCOPY WITH PROPOFOL N/A 07/05/2018   Procedure: COLONOSCOPY WITH PROPOFOL;  Surgeon: Toledo, Benay Pike, MD;  Location: [facility]COPY;  Service: Gastroenterology;  Laterality: N/A;  . ESOPHAGOGASTRODUODENOSCOPY (EGD) WITH PROPOFOL N/A 07/05/2018   Procedure: ESOPHAGOGASTRODUODENOSCOPY (EGD) WITH PROPOFOL;  Surgeon: Toledo, Benay Pike, MD;  Location: [facility] ENDOSCOPY;  Service: Gastroenterology;  Laterality: N/A;  . EUS  07/13/2012   Procedure: UPPER ENDOSCOPIC ULTRASOUND (EUS) LINEAR;  Surgeon: Milus Banister, MD;  Location: [facility];  Service: Endoscopy;  Laterality: N/A;  radial linear      Current Outpatient Medications:  .  Calcium Citrate-Vitamin D (CALCIUM + D PO), Take by mouth., Disp: , Rfl:  .  omeprazole (PRILOSEC OTC) 20 MG tablet, Take 20 mg by mouth daily., Disp: , Rfl:  .  doxycycline (VIBRA-TABS) 100 MG tablet, Take 1 tablet (100 mg total) by mouth 2 (two) times daily., Disp: 20 tablet, Rfl: 0 .  gabapentin (NEURONTIN) 100 MG capsule, , Disp: , Rfl:  .  lovastatin (MEVACOR) 10 MG tablet, Take 10 mg by mouth daily., Disp: , Rfl:  .  NEOMYCIN-POLYMYXIN-HYDROCORTISONE (CORTISPORIN) 1 % SOLN OTIC solution, Apply 1-2 drops to toe BID after soaking,  Disp: 10 mL, Rfl: 1 .  triamterene-hydrochlorothiazide (DYAZIDE) 37.5-25 MG per capsule, Take 1 capsule by mouth every morning., Disp: , Rfl:   No Known Allergies Review of Systems Objective:  There were no vitals filed for this visit.  General: Well developed, nourished, in no acute distress, alert and oriented x3   Dermatological: Skin is warm, dry and supple bilateral. Nails x 10 are well maintained; remaining integument appears unremarkable at this time. There are no open sores, no preulcerative lesions, no rash or signs of infection present.  Hallux nail left demonstrates partial avulsion with purulence and malodor and some bleeding.  2 weeks duration.  Vascular: Dorsalis Pedis artery and Posterior Tibial artery pedal pulses are 2/4 bilateral with immedate capillary fill time. Pedal hair growth present. No varicosities and no lower extremity edema present bilateral.   Neruologic: Grossly intact via light touch bilateral. Vibratory intact via tuning fork bilateral. Protective threshold with Semmes Wienstein monofilament intact to all pedal sites bilateral. Patellar and Achilles deep tendon reflexes 2+ bilateral. No Babinski or clonus noted bilateral.   Musculoskeletal: No gross boney pedal deformities bilateral. No pain, crepitus, or limitation noted with foot and ankle range of motion bilateral. Muscular strength 5/5 in all groups tested bilateral.  Gait: Unassisted, Nonantalgic.    Radiographs:  None taken  Assessment & Plan:   Assessment: Paronychia subungual partial nail avulsion hallux left  Plan: Discussed etiology pathology and surgical therapies at this point we performed a  total nail avulsion with resection of all necrotic and infectious tissue.  She tolerated procedure well after local anesthetic was administered provided with both oral and written home-going instructions for care and soaking of the toe as well as a prescription for doxycycline and Corticosporin otic.   Follow-up with her in 2 weeks     Parisha Beaulac T. Braw[place], [place]

## 2020-09-03 NOTE — Patient Instructions (Signed)

## 2020-09-17 ENCOUNTER — Other Ambulatory Visit: Payer: Self-pay

## 2020-09-17 ENCOUNTER — Ambulatory Visit: Payer: Medicare Other | Admitting: Podiatry

## 2020-09-17 ENCOUNTER — Encounter: Payer: Self-pay | Admitting: Podiatry

## 2020-09-17 DIAGNOSIS — Z9889 Other specified postprocedural states: Secondary | ICD-10-CM

## 2020-09-17 DIAGNOSIS — L03032 Cellulitis of left toe: Secondary | ICD-10-CM

## 2020-09-17 NOTE — Progress Notes (Signed)
She presents today with her husband for follow-up of her nail avulsion and paronychia secondary to trauma.  She states has been doing very well in this white area came up yesterday but there is been no drainage from it.  Objective: Vital signs are stable alert and oriented x3.  Hallux left demonstrates some granulation tissue and an area of white tissue centrally located in the nail bed there is no purulence there is no malodor it appears to be epithelializing very nicely.  I see no signs of infection.  Assessment: Well-healing surgical foot with nail avulsion.  Plan: Encouraged her to continue to soak Epson salts and warm water.  And continue the Corticosporin otic.  I will follow-up with her if this does not go on to get better in the next 2 weeks encouraged her to cover during the daytime but leave open at bedtime.

## 2020-09-25 DIAGNOSIS — M1611 Unilateral primary osteoarthritis, right hip: Secondary | ICD-10-CM | POA: Insufficient documentation

## 2020-12-14 NOTE — Discharge Instructions (Signed)
Instructions after Total Hip Replacement     Azreal Stthomas P. Farrah Skoda, Jr., M.D.     Dept. of Orthopaedics & Sports Medicine  [facility]  [address]  [place], [place]  [address]  Phone: [phone]   Fax: [phone]    DIET: . Drink plenty of non-alcoholic fluids. . Resume your normal diet. Include foods high in fiber.  ACTIVITY:  . You may use crutches or a walker with weight-bearing as tolerated, unless instructed otherwise. . You may be weaned off of the walker or crutches by your Physical Therapist.  . Do NOT reach below the level of your knees or cross your legs until allowed.    . Continue doing gentle exercises. Exercising will reduce the pain and swelling, increase motion, and prevent muscle weakness.   . Please continue to use the TED compression stockings for 6 weeks. You may remove the stockings at night, but should reapply them in the morning. . Do not drive or operate any equipment until instructed.  WOUND CARE:  . Continue to use ice packs periodically to reduce pain and swelling. . Keep the incision clean and dry. . You may bathe or shower after the staples are removed at the first office visit following surgery.  MEDICATIONS: . You may resume your regular medications. . Please take the pain medication as prescribed on the medication. . Do not take pain medication on an empty stomach. . You have been given a prescription for a blood thinner to prevent blood clots. Please take the medication as instructed. (NOTE: After completing a 2 week course of Lovenox, take one Enteric-coated aspirin once a day.) . Pain medications and iron supplements can cause constipation. Use a stool softener (Senokot or Colace) on a daily basis and a laxative (dulcolax or miralax) as needed. . Do not drive or drink alcoholic beverages when taking pain medications.  CALL THE OFFICE FOR: . Temperature above 101 degrees . Excessive bleeding or drainage on the dressing. . Excessive  swelling, coldness, or paleness of the toes. . Persistent nausea and vomiting.  FOLLOW-UP:  . You should have an appointment to return to the office in 6 weeks after surgery. . Arrangements have been made for continuation of Physical Therapy (either home therapy or outpatient therapy).     [facility] Department Directory         [email or web address]       [email or web address]          Cardiology  Appointments: [place] - [phone] [place] - [phone]  Endocrinology  Appointments: [place] - [phone] [place] - [phone]  Gastroenterology  Appointments: [place] - [phone] [place] - [phone]        General Surgery   Appointments: [place] - [phone]  Internal Medicine/Family Medicine  Appointments: [place] - [phone] [place] - [phone] [place] - [phone]  Metabolic and Weigh Loss Surgery  Appointments: [place] - [phone]        Neurology  Appointments: [place] - [phone] [place] - [phone]  Neurosurgery  Appointments: [place] - [phone]  Obstetrics & Gynecology  Appointments: [place] - [phone] [place] - [phone]        Pediatrics  Appointments: [place] - [phone] [place] - [phone]  Physiatry  Appointments: [place] -[phone]  Physical Therapy  Appointments: [place] - [phone] [place] - [phone]        Podiatry  Appointments:  - [phone] [place] - [phone]  Pulmonology  Appointments:  - [phone]  Rheumatology  Appointments:  - [phone]         Location: [facility]  [address] [place], [place]  [address]  [place] Location: [facility] [address] [place], [place]  [address]  [place] Location: [facility] [address] [place], [place]  [address]    

## 2020-12-16 ENCOUNTER — Encounter
Admission: RE | Admit: 2020-12-16 | Discharge: 2020-12-16 | Disposition: A | Discharge status: Home / Self Care | Payer: Medicare Other | Source: Ambulatory Visit | Attending: Orthopedic Surgery | Admitting: Orthopedic Surgery

## 2020-12-16 ENCOUNTER — Other Ambulatory Visit: Payer: Self-pay

## 2020-12-16 DIAGNOSIS — I491 Atrial premature depolarization: Secondary | ICD-10-CM | POA: Diagnosis not present

## 2020-12-16 DIAGNOSIS — Z01818 Encounter for other preprocedural examination: Secondary | ICD-10-CM | POA: Insufficient documentation

## 2020-12-16 LAB — SEDIMENTATION RATE: Sed Rate: 47 mm/hr — ABNORMAL HIGH (ref 0–30)

## 2020-12-16 LAB — C-REACTIVE PROTEIN: CRP: 1.5 mg/dL — ABNORMAL HIGH

## 2020-12-16 LAB — APTT: aPTT: 30 seconds (ref 24–36)

## 2020-12-16 LAB — SURGICAL PCR SCREEN
MRSA, PCR: NEGATIVE
Staphylococcus aureus: NEGATIVE

## 2020-12-16 LAB — URINALYSIS, ROUTINE W REFLEX MICROSCOPIC
Bacteria, UA: NONE SEEN
Bilirubin Urine: NEGATIVE
Glucose, UA: NEGATIVE mg/dL
Ketones, ur: NEGATIVE mg/dL
Leukocytes,Ua: NEGATIVE
Nitrite: NEGATIVE
Protein, ur: NEGATIVE mg/dL
Specific Gravity, Urine: 1.01 (ref 1.005–1.030)
pH: 7 (ref 5.0–8.0)

## 2020-12-16 LAB — CBC
HCT: 36.7 % (ref 36.0–46.0)
Hemoglobin: 11.7 g/dL — ABNORMAL LOW (ref 12.0–15.0)
MCH: 27.8 pg (ref 26.0–34.0)
MCHC: 31.9 g/dL (ref 30.0–36.0)
MCV: 87.2 fL (ref 80.0–100.0)
Platelets: 225 10*3/uL (ref 150–400)
RBC: 4.21 MIL/uL (ref 3.87–5.11)
RDW: 14.4 % (ref 11.5–15.5)
WBC: 5.4 10*3/uL (ref 4.0–10.5)
nRBC: 0 % (ref 0.0–0.2)

## 2020-12-16 LAB — COMPREHENSIVE METABOLIC PANEL WITH GFR
ALT: 12 U/L (ref 0–44)
AST: 19 U/L (ref 15–41)
Albumin: 4.1 g/dL (ref 3.5–5.0)
Alkaline Phosphatase: 72 U/L (ref 38–126)
Anion gap: 9 (ref 5–15)
BUN: 17 mg/dL (ref 8–23)
CO2: 27 mmol/L (ref 22–32)
Calcium: 9.7 mg/dL (ref 8.9–10.3)
Chloride: 100 mmol/L (ref 98–111)
Creatinine, Ser: 1 mg/dL (ref 0.44–1.00)
GFR, Estimated: 58 mL/min — ABNORMAL LOW
Glucose, Bld: 87 mg/dL (ref 70–99)
Potassium: 3.3 mmol/L — ABNORMAL LOW (ref 3.5–5.1)
Sodium: 136 mmol/L (ref 135–145)
Total Bilirubin: 0.9 mg/dL (ref 0.3–1.2)
Total Protein: 8.1 g/dL (ref 6.5–8.1)

## 2020-12-16 LAB — TYPE AND SCREEN
ABO/RH(D): A NEG
Antibody Screen: NEGATIVE

## 2020-12-16 LAB — PROTIME-INR
INR: 1.1 (ref 0.8–1.2)
Prothrombin Time: 14.1 seconds (ref 11.4–15.2)

## 2020-12-16 NOTE — Patient Instructions (Signed)
Your procedure is scheduled on: Wednesday 12/24/20.  Report to THE FIRST FLOOR REGISTRATION DESK IN THE MEDICAL MALL ON THE MORNING OF SURGERY FIRST, THEN YOU WILL CHECK IN AT THE SURGERY INFORMATION DESK LOCATED OUTSIDE THE SAME DAY SURGERY DEPARTMENT LOCATED ON 2ND FLOOR MEDICAL MALL ENTRANCE.  To find out your arrival time please call [phone] between 1PM - 3PM on Tuesday 12/23/20.   Remember: Instructions that are not followed completely may result in serious medical risk, up to and including death, or upon the discretion of your surgeon and anesthesiologist your surgery may need to be rescheduled.     __X__ 1. Do not eat food after midnight the night before your procedure.                 No gum chewing or hard candies. You may drink clear liquids up to 2 hours                 before you are scheduled to arrive for your surgery- DO NOT drink clear                 liquids within 2 hours of the start of your surgery.                 Clear Liquids include:  water, apple juice without pulp, clear carbohydrate                 drink such as Clearfast or Gatorade, Black Coffee or Tea (Do not add                 milk or creamer to coffee or tea).   **Dr. Marry Guan has ordered a Clear Pre-Surgery Ensure. He wants you to finish that drink on the morning of surgery 2 hours prior to your arrival.   __X__2.  On the morning of surgery brush your teeth with toothpaste and water, you may rinse your mouth with mouthwash if you wish.  Do not swallow any toothpaste or mouthwash.    __X__ 3.  No Alcohol for 24 hours before or after surgery.  __X__ 4.  Do Not Smoke or use e-cigarettes For 24 Hours Prior to Your Surgery.                 Do not use any chewable tobacco products for at least 6 hours prior to                 surgery.  __X__5.  Notify your doctor if there is any change in your medical condition      (cold, fever, infections).      Do NOT wear jewelry, make-up, hairpins, clips or nail  polish. Do NOT wear lotions, powders, or perfumes.  Do NOT shave 48 hours prior to surgery. Men may shave face and neck. Do NOT bring valuables to the hospital.     [facility] is not responsible for any belongings or valuables.   Contacts, dentures/partials or body piercings may not be worn into surgery. Bring a case for your contacts, glasses or hearing aids, a denture cup will be supplied.   Leave your suitcase in the car. After surgery it may be brought to your room.   For patients admitted to the hospital, discharge time is determined by your treatment team.   __X__ Take these medicines the morning of surgery with A SIP OF WATER:     1. omeprazole (PRILOSEC OTC)  2. lovastatin (MEVACOR)  __X__ Use CHG Soap as directed.  __X__ Stop Anti-inflammatories 7 days before surgery such as Advil, Ibuprofen, Motrin, BC or Goodies Powder, Naprosyn, Naproxen, Aleve, Aspirin, Meloxicam. May take Tylenol if needed for pain or discomfort.   __X__Do not start taking any new herbal supplements or vitamins prior to your procedure.     Wear comfortable clothing (specific to your surgery type) to the hospital.  Plan for stool softeners for home use; pain medications have a tendency to cause constipation. You can also help prevent constipation by eating foods high in fiber such as fruits and vegetables and drinking plenty of fluids as your diet allows.  After surgery, you can prevent lung complications by doing breathing exercises.Take deep breaths and cough every 1-2 hours. Your doctor may order a device called an Incentive Spirometer to help you take deep breaths.  Please call the [place] Department at [phone] if you have any questions about these instructions.

## 2020-12-18 LAB — URINE CULTURE
Culture: NO GROWTH
Special Requests: NORMAL

## 2020-12-20 NOTE — H&P (Signed)
ORTHOPAEDIC HISTORY & PHYSICAL Margaret Cannon, [place] - 12/16/2020 1:45 PM EDT Formatting of this note is different from the original. [place] MEDICINE Chief Complaint:   Chief Complaint  Patient presents with  . Hip Pain  H & P RIGHT HIP   History of Present Illness:   Margaret Cannon is a 80 y.o. female that presents to clinic today for her preoperative history and evaluation. The patient is scheduled to undergo a right total hip arthroplasty on 12/24/20 by Dr. Marry Guan. Her pain began several years ago. The pain is located in the right hip and groin. She describes her pain as worse with weightbearing and movement of the hip. She reports associated loss of range of motion plus pain with putting on shoes and socks. She denies associated numbness or tingling.   The patient's symptoms have progressed to the point that they decrease her quality of life. The patient has previously undergone conservative treatment including NSAIDS and activity modification without adequate control of her symptoms.  Patient denies history of lumbar surgery. She has received cardiac clearance.   Past Medical, Surgical, Family, Social History, Allergies, Medications:   Past Medical History:  Past Medical History:  Diagnosis Date  . GERD (gastroesophageal reflux disease)  . Glaucoma suspect of left eye 01/11/2014  Dr. Grayland Jack  . Heart murmur, unspecified  . Hyperlipidemia  . Hypertension  . Obesity  . Osteopenia 08/13/2014   Past Surgical History:  Past Surgical History:  Procedure Laterality Date  . cardiac cath 2010  no CAD  . COLONOSCOPY 07/05/2018  Tubular adenoma of the colon/No Repeat due to age/TKT  . EGD 07/05/2018  Normal EGD/No Repeat/TKT  . TONSILLECTOMY   Current Medications:  Current Outpatient Medications  Medication Sig Dispense Refill  . acetaminophen (TYLENOL ARTHRITIS ORAL) Take 2 tablets by mouth every 6 (six) hours as needed  .  CALCIUM ORAL Take 800 mg by mouth once daily  . lovastatin (MEVACOR) 10 MG tablet Take 1 tablet (10 mg total) by mouth daily with dinner 30 tablet 11  . omeprazole (PRILOSEC) 20 MG DR capsule Take 20 mg by mouth once daily  . propylene glycoL (SYSTANE BALANCE) 0.6 % ophthalmic drops Place 1 drop into both eyes as needed for Dry Eyes  . triamterene-hydrochlorothiazide (DYAZIDE) 37.5-25 mg capsule Take 1 capsule by mouth once daily 90 capsule 3  . UNABLE TO FIND as needed Med Name: Theraworx Foam for joint discomfort and inflammation  . triamcinolone 0.1 % cream Apply topically 2 (two) times daily for 90 days 453.6 g 2   No current facility-administered medications for this visit.   Allergies: No Known Allergies  Social History:  Social History   Socioeconomic History  . Marital status: Married  Spouse name: Edd Arbour  . Number of children: 2  . Years of education: 20  . Highest education level: High school graduate  Occupational History  . Occupation: Retired  Tobacco Use  . Smoking status: Former Smoker  Packs/day: 1.00  Years: 48.00  Pack years: 48.00  Types: Cigarettes  Quit date: 07/12/2009  Years since quitting: 11.4  . Smokeless tobacco: Never Used  Vaping Use  . Vaping Use: Never used  Substance and Sexual Activity  . Alcohol use: No  . Drug use: No  . Sexual activity: Defer  Partners: Male  Birth control/protection: Post-menopausal  Comment: husband  Other Topics Concern  . Not on file  Social History Narrative  Married to [employer]. They have  three children together. She loves to watch [employer] [place] [employer] [place] during the day.   Social Determinants of Health   Financial Resource Strain: Not on file  Food Insecurity: Not on file  Transportation Needs: Not on file  Physical Activity: Not on file  Stress: Not on file  Social Connections: Not on file  Housing Stability: Not on file   Family History:  Family History  Problem Relation Age of  Onset  . Alzheimer's disease Sister  . Cancer Mother  unknown  . Coronary Artery Disease (Blocked arteries around heart) Father  . No Known Problems Brother  . No Known Problems Sister  . No Known Problems Sister  . Cancer Sister  . Depression Sister  . Coronary Artery Disease (Blocked arteries around heart) Brother  . Coronary Artery Disease (Blocked arteries around heart) Brother  . No Known Problems Brother  . No Known Problems Brother   Review of Systems:   A 10+ ROS was performed, reviewed, and the pertinent orthopaedic findings are documented in the HPI.   Physical Examination:   BP (!) 148/68 (BP Location: Left upper arm, Patient Position: Sitting, BP Cuff Size: Adult)  Ht 160 cm (5\' 3" )  Wt 88.5 kg (195 lb 3.2 oz)  BMI 34.58 kg/m   Patient is a well-developed, well-nourished female in no acute distress. Patient has normal mood and affect. Patient is alert and oriented to person, place, and time.   HEENT: Atraumatic, normocephalic. Pupils equal and reactive to light. Extraocular motion intact. Noninjected sclera.  Cardiovascular: Regular rate and rhythm, with no murmurs, rubs, or gallops. Distal pulses palpable.  Respiratory: Lungs clear to auscultation bilaterally.   Right Hip: Pelvic tilt: Negative Limb lengths: Grossly equal with the patient standing Soft tissue swelling: Negative Erythema: Negative Crepitance: Positive Tenderness: Greater trochanter is nontender to palpation. Severe pain is elicited by axial compression or extremes of rotation. Atrophy: No atrophy. Fair to good hip flexor and abductor strength. Range of Motion: EXT/FLEX: 0/0/100 ADD/ABD: 10/0/10 IR/ER: 5/0/5  Sensation is intact over the saphenous, lateral cutaneous, superficial fibular, and deep fibular nerve distributions.  Tests Performed/Reviewed:  X-rays  No new radiographs were obtained today. Previous radiographs were reviewed of the right hip and revealed severe loss of  femoroacetabular joint space with significant osteophyte formation. No fractures or dislocations noted.   Impression:   ICD-10-CM  1. Primary osteoarthritis of right hip M16.11  2. Severe obesity (BMI 35.0-39.9) with comorbidity (CMS-HCC) E66.01   Plan:   The patient has end-stage degenerative changes of the right hip. It was explained to the patient that the condition is progressive in nature. Having failed conservative treatment, the patient has elected to proceed with a total joint arthroplasty. The patient will undergo a total joint arthroplasty with Dr. Marry Guan. The risks of surgery, including blood clot and infection, were discussed with the patient. Measures to reduce these risks, including the use of anticoagulation, perioperative antibiotics, and early ambulation were discussed. The importance of postoperative physical therapy was discussed with the patient. The patient elects to proceed with surgery. The patient is instructed to stop all blood thinners prior to surgery. The patient is instructed to call the hospital the day before surgery to learn of the proper arrival time.   Contact our office with any questions or concerns. Follow up as indicated, or sooner should any new problems arise, if conditions worsen, or if they are otherwise concerned.   Margaret Fudge, PA-C [facility] [place], [place] [address] Phone: [phone]  This note was generated in part with voice recognition software and I apologize for any typographical errors that were not detected and corrected.   Electronically signed by Margaret Fudge, PA at 12/20/2020 4:14 PM EDT

## 2020-12-22 ENCOUNTER — Other Ambulatory Visit
Admission: RE | Admit: 2020-12-22 | Discharge: 2020-12-22 | Disposition: A | Discharge status: Home / Self Care | Payer: Medicare Other | Source: Ambulatory Visit | Attending: Orthopedic Surgery | Admitting: Orthopedic Surgery

## 2020-12-22 ENCOUNTER — Other Ambulatory Visit: Payer: Self-pay

## 2020-12-22 DIAGNOSIS — Z01812 Encounter for preprocedural laboratory examination: Secondary | ICD-10-CM | POA: Diagnosis present

## 2020-12-22 DIAGNOSIS — Z20822 Contact with and (suspected) exposure to covid-19: Secondary | ICD-10-CM | POA: Insufficient documentation

## 2020-12-22 LAB — SARS CORONAVIRUS 2 (TAT 6-24 HRS): SARS Coronavirus 2: NEGATIVE

## 2020-12-23 ENCOUNTER — Encounter: Payer: Self-pay | Admitting: Orthopedic Surgery

## 2020-12-24 ENCOUNTER — Ambulatory Visit: Payer: Medicare Other | Admitting: Anesthesiology

## 2020-12-24 ENCOUNTER — Encounter
Admission: RE | Disposition: A | Discharge status: Home Health | Payer: Self-pay | Source: Home / Self Care | Attending: Orthopedic Surgery

## 2020-12-24 ENCOUNTER — Encounter: Payer: Self-pay | Admitting: Orthopedic Surgery

## 2020-12-24 ENCOUNTER — Inpatient Hospital Stay
Admission: RE | Admit: 2020-12-24 | Discharge: 2020-12-28 | DRG: 469 | Disposition: A | Discharge status: Home Health | Payer: Medicare Other | Attending: Orthopedic Surgery | Admitting: Orthopedic Surgery

## 2020-12-24 ENCOUNTER — Observation Stay: Payer: Medicare Other

## 2020-12-24 ENCOUNTER — Other Ambulatory Visit: Payer: Self-pay

## 2020-12-24 DIAGNOSIS — K219 Gastro-esophageal reflux disease without esophagitis: Secondary | ICD-10-CM | POA: Diagnosis present

## 2020-12-24 DIAGNOSIS — L209 Atopic dermatitis, unspecified: Secondary | ICD-10-CM | POA: Diagnosis present

## 2020-12-24 DIAGNOSIS — E785 Hyperlipidemia, unspecified: Secondary | ICD-10-CM | POA: Diagnosis present

## 2020-12-24 DIAGNOSIS — Z79899 Other long term (current) drug therapy: Secondary | ICD-10-CM

## 2020-12-24 DIAGNOSIS — R0989 Other specified symptoms and signs involving the circulatory and respiratory systems: Secondary | ICD-10-CM

## 2020-12-24 DIAGNOSIS — E669 Obesity, unspecified: Secondary | ICD-10-CM | POA: Diagnosis present

## 2020-12-24 DIAGNOSIS — Z6835 Body mass index (BMI) 35.0-35.9, adult: Secondary | ICD-10-CM

## 2020-12-24 DIAGNOSIS — Z87891 Personal history of nicotine dependence: Secondary | ICD-10-CM

## 2020-12-24 DIAGNOSIS — I4891 Unspecified atrial fibrillation: Secondary | ICD-10-CM | POA: Diagnosis present

## 2020-12-24 DIAGNOSIS — Z20822 Contact with and (suspected) exposure to covid-19: Secondary | ICD-10-CM | POA: Diagnosis present

## 2020-12-24 DIAGNOSIS — Z7901 Long term (current) use of anticoagulants: Secondary | ICD-10-CM

## 2020-12-24 DIAGNOSIS — M8580 Other specified disorders of bone density and structure, unspecified site: Secondary | ICD-10-CM | POA: Diagnosis present

## 2020-12-24 DIAGNOSIS — M1611 Unilateral primary osteoarthritis, right hip: Principal | ICD-10-CM | POA: Diagnosis present

## 2020-12-24 DIAGNOSIS — I10 Essential (primary) hypertension: Secondary | ICD-10-CM | POA: Diagnosis present

## 2020-12-24 DIAGNOSIS — Z96649 Presence of unspecified artificial hip joint: Secondary | ICD-10-CM

## 2020-12-24 DIAGNOSIS — H40002 Preglaucoma, unspecified, left eye: Secondary | ICD-10-CM | POA: Diagnosis present

## 2020-12-24 DIAGNOSIS — G8929 Other chronic pain: Secondary | ICD-10-CM | POA: Diagnosis present

## 2020-12-24 DIAGNOSIS — J189 Pneumonia, unspecified organism: Secondary | ICD-10-CM | POA: Diagnosis not present

## 2020-12-24 DIAGNOSIS — D62 Acute posthemorrhagic anemia: Secondary | ICD-10-CM | POA: Diagnosis not present

## 2020-12-24 HISTORY — PX: TOTAL HIP ARTHROPLASTY: SHX124

## 2020-12-24 LAB — ABO/RH: ABO/RH(D): A NEG

## 2020-12-24 SURGERY — ARTHROPLASTY, HIP, TOTAL,POSTERIOR APPROACH
Anesthesia: Spinal | Site: Hip | Laterality: Right

## 2020-12-24 MED ORDER — LACTATED RINGERS IV SOLN: INTRAVENOUS | Status: DC

## 2020-12-24 MED ORDER — GABAPENTIN 300 MG PO CAPS: 300.0000 mg | ORAL_CAPSULE | Freq: Once | ORAL | Status: AC

## 2020-12-24 MED ORDER — ENOXAPARIN SODIUM 30 MG/0.3ML ~~LOC~~ SOLN
30.0000 mg | Freq: Two times a day (BID) | SUBCUTANEOUS | Status: DC
  Administered 2020-12-25 – 2020-12-28 (×7): 30 mg via SUBCUTANEOUS
  Filled 2020-12-24 (×7): qty 0.3

## 2020-12-24 MED ORDER — PHENOL 1.4 % MT LIQD
1.0000 | OROMUCOSAL | Status: DC | PRN
  Filled 2020-12-24: qty 177

## 2020-12-24 MED ORDER — OXYCODONE HCL 5 MG PO TABS: 5.0000 mg | ORAL_TABLET | ORAL | Status: DC | PRN

## 2020-12-24 MED ORDER — SENNOSIDES-DOCUSATE SODIUM 8.6-50 MG PO TABS
1.0000 | ORAL_TABLET | Freq: Two times a day (BID) | ORAL | Status: DC
  Administered 2020-12-24 – 2020-12-25 (×3): 1 via ORAL
  Filled 2020-12-24 (×5): qty 1

## 2020-12-24 MED ORDER — CHLORHEXIDINE GLUCONATE 0.12 % MT SOLN
OROMUCOSAL | Status: AC
  Administered 2020-12-24: 15 mL via OROMUCOSAL
  Filled 2020-12-24: qty 15

## 2020-12-24 MED ORDER — PROPOFOL 500 MG/50ML IV EMUL
INTRAVENOUS | Status: AC
  Filled 2020-12-24: qty 50

## 2020-12-24 MED ORDER — FLEET ENEMA 7-19 GM/118ML RE ENEM: 1.0000 | ENEMA | Freq: Once | RECTAL | Status: DC | PRN

## 2020-12-24 MED ORDER — OXYCODONE HCL 5 MG PO TABS: 10.0000 mg | ORAL_TABLET | ORAL | Status: DC | PRN

## 2020-12-24 MED ORDER — SODIUM CHLORIDE 0.9 % IV SOLN
INTRAVENOUS | Status: DC | PRN
  Administered 2020-12-24: 30 ug/min via INTRAVENOUS

## 2020-12-24 MED ORDER — GABAPENTIN 300 MG PO CAPS
ORAL_CAPSULE | ORAL | Status: AC
  Administered 2020-12-24: 300 mg via ORAL
  Filled 2020-12-24: qty 1

## 2020-12-24 MED ORDER — TRANEXAMIC ACID-NACL 1000-0.7 MG/100ML-% IV SOLN
1000.0000 mg | INTRAVENOUS | Status: AC
  Administered 2020-12-24: 1000 mg via INTRAVENOUS

## 2020-12-24 MED ORDER — POLYVINYL ALCOHOL 1.4 % OP SOLN
Freq: Every day | OPHTHALMIC | Status: DC | PRN
  Filled 2020-12-24: qty 15

## 2020-12-24 MED ORDER — DEXAMETHASONE SODIUM PHOSPHATE 10 MG/ML IJ SOLN: 8.0000 mg | Freq: Once | INTRAMUSCULAR | Status: AC

## 2020-12-24 MED ORDER — FERROUS SULFATE 325 (65 FE) MG PO TABS
325.0000 mg | ORAL_TABLET | Freq: Two times a day (BID) | ORAL | Status: DC
  Administered 2020-12-24 – 2020-12-28 (×8): 325 mg via ORAL
  Filled 2020-12-24 (×8): qty 1

## 2020-12-24 MED ORDER — GLYCOPYRROLATE 0.2 MG/ML IJ SOLN
INTRAMUSCULAR | Status: DC | PRN
  Administered 2020-12-24: .2 mg via INTRAVENOUS

## 2020-12-24 MED ORDER — CELECOXIB 200 MG PO CAPS
ORAL_CAPSULE | ORAL | Status: AC
  Administered 2020-12-24: 400 mg via ORAL
  Filled 2020-12-24: qty 2

## 2020-12-24 MED ORDER — MENTHOL 3 MG MT LOZG
1.0000 | LOZENGE | OROMUCOSAL | Status: DC | PRN
  Filled 2020-12-24: qty 9

## 2020-12-24 MED ORDER — ACETAMINOPHEN 10 MG/ML IV SOLN
INTRAVENOUS | Status: DC | PRN
  Administered 2020-12-24: 1000 mg via INTRAVENOUS

## 2020-12-24 MED ORDER — ONDANSETRON HCL 4 MG/2ML IJ SOLN
INTRAMUSCULAR | Status: DC | PRN
  Administered 2020-12-24: 4 mg via INTRAVENOUS

## 2020-12-24 MED ORDER — CEFAZOLIN SODIUM-DEXTROSE 2-4 GM/100ML-% IV SOLN
INTRAVENOUS | Status: AC
  Filled 2020-12-24: qty 100

## 2020-12-24 MED ORDER — TRAMADOL HCL 50 MG PO TABS
50.0000 mg | ORAL_TABLET | ORAL | Status: DC | PRN
  Administered 2020-12-24 – 2020-12-25 (×2): 50 mg via ORAL
  Filled 2020-12-24 (×2): qty 1

## 2020-12-24 MED ORDER — CELECOXIB 200 MG PO CAPS
200.0000 mg | ORAL_CAPSULE | Freq: Two times a day (BID) | ORAL | Status: DC
  Administered 2020-12-24 – 2020-12-28 (×9): 200 mg via ORAL
  Filled 2020-12-24 (×9): qty 1

## 2020-12-24 MED ORDER — NEOMYCIN-POLYMYXIN B GU 40-200000 IR SOLN
Status: DC | PRN
  Administered 2020-12-24: 2 mL

## 2020-12-24 MED ORDER — HYDROMORPHONE HCL 1 MG/ML IJ SOLN: 0.5000 mg | INTRAMUSCULAR | Status: DC | PRN

## 2020-12-24 MED ORDER — TRIAMCINOLONE ACETONIDE 0.1 % EX CREA
1.0000 "application " | TOPICAL_CREAM | Freq: Two times a day (BID) | CUTANEOUS | Status: DC | PRN
  Filled 2020-12-24: qty 15

## 2020-12-24 MED ORDER — TRANEXAMIC ACID-NACL 1000-0.7 MG/100ML-% IV SOLN
INTRAVENOUS | Status: AC
  Administered 2020-12-24: 1000 mg via INTRAVENOUS
  Filled 2020-12-24: qty 100

## 2020-12-24 MED ORDER — DIPHENHYDRAMINE HCL 12.5 MG/5ML PO ELIX
12.5000 mg | ORAL_SOLUTION | ORAL | Status: DC | PRN
  Administered 2020-12-26 – 2020-12-28 (×3): 25 mg via ORAL
  Filled 2020-12-24 (×3): qty 10

## 2020-12-24 MED ORDER — CELECOXIB 200 MG PO CAPS: 400.0000 mg | ORAL_CAPSULE | Freq: Once | ORAL | Status: AC

## 2020-12-24 MED ORDER — BUPIVACAINE HCL (PF) 0.5 % IJ SOLN
INTRAMUSCULAR | Status: DC | PRN
  Administered 2020-12-24: 2.6 mL

## 2020-12-24 MED ORDER — CHLORHEXIDINE GLUCONATE 4 % EX LIQD
60.0000 mL | Freq: Once | CUTANEOUS | Status: AC
  Administered 2020-12-24: 4 via TOPICAL

## 2020-12-24 MED ORDER — FENTANYL CITRATE (PF) 100 MCG/2ML IJ SOLN
INTRAMUSCULAR | Status: AC
  Administered 2020-12-24: 25 ug
  Filled 2020-12-24: qty 2

## 2020-12-24 MED ORDER — SURGIRINSE WOUND IRRIGATION SYSTEM - OPTIME
TOPICAL | Status: DC | PRN
  Administered 2020-12-24: 900 mL

## 2020-12-24 MED ORDER — TRIAMTERENE-HCTZ 37.5-25 MG PO TABS
1.0000 | ORAL_TABLET | ORAL | Status: DC
  Administered 2020-12-25 – 2020-12-28 (×4): 1 via ORAL
  Filled 2020-12-24 (×4): qty 1

## 2020-12-24 MED ORDER — TETRACAINE HCL 1 % IJ SOLN
INTRAMUSCULAR | Status: DC | PRN
  Administered 2020-12-24: 1 mg via INTRASPINAL

## 2020-12-24 MED ORDER — ALUM & MAG HYDROXIDE-SIMETH 200-200-20 MG/5ML PO SUSP: 30.0000 mL | ORAL | Status: DC | PRN

## 2020-12-24 MED ORDER — FENTANYL CITRATE (PF) 100 MCG/2ML IJ SOLN
INTRAMUSCULAR | Status: DC | PRN
  Administered 2020-12-24 (×3): 25 ug via INTRAVENOUS
  Administered 2020-12-24: 50 ug via INTRAVENOUS
  Administered 2020-12-24: 25 ug via INTRAVENOUS

## 2020-12-24 MED ORDER — BISACODYL 10 MG RE SUPP: 10.0000 mg | Freq: Every day | RECTAL | Status: DC | PRN

## 2020-12-24 MED ORDER — SODIUM CHLORIDE 0.9 % IV SOLN: INTRAVENOUS | Status: DC

## 2020-12-24 MED ORDER — FENTANYL CITRATE (PF) 100 MCG/2ML IJ SOLN
INTRAMUSCULAR | Status: AC
  Filled 2020-12-24: qty 2

## 2020-12-24 MED ORDER — PANTOPRAZOLE SODIUM 40 MG PO TBEC
40.0000 mg | DELAYED_RELEASE_TABLET | Freq: Two times a day (BID) | ORAL | Status: DC
  Administered 2020-12-24 – 2020-12-28 (×9): 40 mg via ORAL
  Filled 2020-12-24 (×9): qty 1

## 2020-12-24 MED ORDER — MAGNESIUM HYDROXIDE 400 MG/5ML PO SUSP
30.0000 mL | Freq: Every day | ORAL | Status: DC
  Administered 2020-12-24 – 2020-12-25 (×2): 30 mL via ORAL
  Filled 2020-12-24 (×3): qty 30

## 2020-12-24 MED ORDER — CHLORHEXIDINE GLUCONATE 0.12 % MT SOLN: 15.0000 mL | Freq: Once | OROMUCOSAL | Status: AC

## 2020-12-24 MED ORDER — ONDANSETRON HCL 4 MG/2ML IJ SOLN: 4.0000 mg | Freq: Four times a day (QID) | INTRAMUSCULAR | Status: DC | PRN

## 2020-12-24 MED ORDER — ORAL CARE MOUTH RINSE: 15.0000 mL | Freq: Once | OROMUCOSAL | Status: AC

## 2020-12-24 MED ORDER — PRAVASTATIN SODIUM 20 MG PO TABS
10.0000 mg | ORAL_TABLET | Freq: Every day | ORAL | Status: DC
  Administered 2020-12-25 – 2020-12-27 (×3): 10 mg via ORAL
  Filled 2020-12-24 (×3): qty 1

## 2020-12-24 MED ORDER — METOCLOPRAMIDE HCL 10 MG PO TABS
10.0000 mg | ORAL_TABLET | Freq: Three times a day (TID) | ORAL | Status: AC
  Administered 2020-12-24 – 2020-12-26 (×6): 10 mg via ORAL
  Filled 2020-12-24 (×7): qty 1

## 2020-12-24 MED ORDER — EPHEDRINE SULFATE 50 MG/ML IJ SOLN
INTRAMUSCULAR | Status: DC | PRN
  Administered 2020-12-24: 10 mg via INTRAVENOUS

## 2020-12-24 MED ORDER — ACETAMINOPHEN 10 MG/ML IV SOLN
1000.0000 mg | Freq: Four times a day (QID) | INTRAVENOUS | Status: AC
  Administered 2020-12-24 – 2020-12-25 (×4): 1000 mg via INTRAVENOUS
  Filled 2020-12-24 (×4): qty 100

## 2020-12-24 MED ORDER — ACETAMINOPHEN 325 MG PO TABS
325.0000 mg | ORAL_TABLET | Freq: Four times a day (QID) | ORAL | Status: DC | PRN
  Administered 2020-12-26 – 2020-12-28 (×5): 650 mg via ORAL
  Filled 2020-12-24 (×5): qty 2

## 2020-12-24 MED ORDER — TRANEXAMIC ACID-NACL 1000-0.7 MG/100ML-% IV SOLN
INTRAVENOUS | Status: AC
  Filled 2020-12-24: qty 100

## 2020-12-24 MED ORDER — CALCIUM CARBONATE-VITAMIN D 500-200 MG-UNIT PO TABS
1.0000 | ORAL_TABLET | Freq: Every day | ORAL | Status: DC
  Administered 2020-12-24 – 2020-12-28 (×5): 1 via ORAL
  Filled 2020-12-24 (×5): qty 1

## 2020-12-24 MED ORDER — PROPOFOL 500 MG/50ML IV EMUL
INTRAVENOUS | Status: DC | PRN
  Administered 2020-12-24: 125 ug/kg/min via INTRAVENOUS

## 2020-12-24 MED ORDER — CEFAZOLIN SODIUM-DEXTROSE 2-4 GM/100ML-% IV SOLN
2.0000 g | Freq: Four times a day (QID) | INTRAVENOUS | Status: AC
  Administered 2020-12-24 (×2): 2 g via INTRAVENOUS
  Filled 2020-12-24 (×2): qty 100

## 2020-12-24 MED ORDER — DEXAMETHASONE SODIUM PHOSPHATE 10 MG/ML IJ SOLN
INTRAMUSCULAR | Status: AC
  Administered 2020-12-24: 8 mg via INTRAVENOUS
  Filled 2020-12-24: qty 1

## 2020-12-24 MED ORDER — PROPOFOL 10 MG/ML IV BOLUS
INTRAVENOUS | Status: DC | PRN
  Administered 2020-12-24: 30 mg via INTRAVENOUS
  Administered 2020-12-24: 40 mg via INTRAVENOUS

## 2020-12-24 MED ORDER — CEFAZOLIN SODIUM-DEXTROSE 2-4 GM/100ML-% IV SOLN
2.0000 g | INTRAVENOUS | Status: AC
  Administered 2020-12-24: 2 g via INTRAVENOUS

## 2020-12-24 MED ORDER — ONDANSETRON HCL 4 MG PO TABS: 4.0000 mg | ORAL_TABLET | Freq: Four times a day (QID) | ORAL | Status: DC | PRN

## 2020-12-24 MED ORDER — TRANEXAMIC ACID-NACL 1000-0.7 MG/100ML-% IV SOLN: 1000.0000 mg | Freq: Once | INTRAVENOUS | Status: AC

## 2020-12-24 SURGICAL SUPPLY — 60 items
BLADE DRUM FLTD (BLADE) ×2 IMPLANT
BLADE SAW 90X25X1.19 OSCILLAT (BLADE) ×2 IMPLANT
CANISTER SUCT 1200ML W/VALVE (MISCELLANEOUS) ×2 IMPLANT
CANISTER SUCT 3000ML PPV (MISCELLANEOUS) ×4 IMPLANT
CARTRIDGE OIL MAESTRO DRILL (MISCELLANEOUS) ×1 IMPLANT
COVER WAND RF STERILE (DRAPES) ×2 IMPLANT
DIFFUSER DRILL AIR PNEUMATIC (MISCELLANEOUS) ×2 IMPLANT
DRAPE 3/4 80X56 (DRAPES) ×2 IMPLANT
DRAPE INCISE IOBAN 66X60 STRL (DRAPES) ×2 IMPLANT
DRSG DERMACEA 8X12 NADH (GAUZE/BANDAGES/DRESSINGS) ×2 IMPLANT
DRSG MEPILEX SACRM 8.7X9.8 (GAUZE/BANDAGES/DRESSINGS) ×2 IMPLANT
DRSG OPSITE POSTOP 4X14 (GAUZE/BANDAGES/DRESSINGS) ×1 IMPLANT
DRSG TEGADERM 4X4.75 (GAUZE/BANDAGES/DRESSINGS) ×2 IMPLANT
DURAPREP 26ML APPLICATOR (WOUND CARE) ×2 IMPLANT
ELECT CAUTERY BLADE 6.4 (BLADE) ×2 IMPLANT
ELECT REM PT RETURN 9FT ADLT (ELECTROSURGICAL) ×2
ELECTRODE REM PT RTRN 9FT ADLT (ELECTROSURGICAL) ×1 IMPLANT
GLOVE SURG ENC MOIS LTX SZ7.5 (GLOVE) ×4 IMPLANT
GLOVE SURG ENC TEXT LTX SZ7.5 (GLOVE) ×4 IMPLANT
GLOVE SURG UNDER LTX SZ8 (GLOVE) ×2 IMPLANT
GLOVE SURG UNDER POLY LF SZ7.5 (GLOVE) ×2 IMPLANT
GOWN STRL REUS W/ TWL LRG LVL3 (GOWN DISPOSABLE) ×2 IMPLANT
GOWN STRL REUS W/ TWL XL LVL3 (GOWN DISPOSABLE) ×1 IMPLANT
GOWN STRL REUS W/TWL LRG LVL3 (GOWN DISPOSABLE) ×2
GOWN STRL REUS W/TWL XL LVL3 (GOWN DISPOSABLE) ×1
HEAD FEM STD 32X+1 STRL (Hips) ×1 IMPLANT
HEMOVAC 400CC 10FR (MISCELLANEOUS) ×2 IMPLANT
HOLDER FOLEY CATH W/STRAP (MISCELLANEOUS) ×2 IMPLANT
HOOD PEEL AWAY FLYTE STAYCOOL (MISCELLANEOUS) ×4 IMPLANT
IRRIGATION SURGIPHOR STRL (IV SOLUTION) ×2 IMPLANT
IV NS IRRIG 3000ML ARTHROMATIC (IV SOLUTION) ×2 IMPLANT
KIT PEG BOARD PINK (KITS) ×2 IMPLANT
KIT TURNOVER KIT A (KITS) ×2 IMPLANT
LINER MARATHON 10 DEG 32MMX450 (Hips) ×1 IMPLANT
LINER MARATHON 10D 32MMX450 (Hips) IMPLANT
MANIFOLD NEPTUNE II (INSTRUMENTS) ×4 IMPLANT
NDL SAFETY ECLIPSE 18X1.5 (NEEDLE) ×1 IMPLANT
NEEDLE HYPO 18GX1.5 SHARP (NEEDLE) ×1
NS IRRIG 500ML POUR BTL (IV SOLUTION) ×2 IMPLANT
OIL CARTRIDGE MAESTRO DRILL (MISCELLANEOUS) ×2
PACK HIP PROSTHESIS (MISCELLANEOUS) ×2 IMPLANT
PENCIL SMOKE EVACUATOR COATED (MISCELLANEOUS) ×2 IMPLANT
PIN SECT CUP 50MM (Hips) ×1 IMPLANT
PULSAVAC PLUS IRRIG FAN TIP (DISPOSABLE) ×2
SOL PREP PVP 2OZ (MISCELLANEOUS) ×2
SOLUTION PREP PVP 2OZ (MISCELLANEOUS) ×1 IMPLANT
SPONGE DRAIN TRACH 4X4 STRL 2S (GAUZE/BANDAGES/DRESSINGS) ×2 IMPLANT
STAPLER SKIN PROX 35W (STAPLE) ×2 IMPLANT
STEM AML 12X155X30 STD SM 6IN (Joint) ×1 IMPLANT
SUT ETHIBOND #5 BRAIDED 30INL (SUTURE) ×2 IMPLANT
SUT VIC AB 0 CT1 36 (SUTURE) ×2 IMPLANT
SUT VIC AB 1 CT1 36 (SUTURE) ×4 IMPLANT
SUT VIC AB 2-0 CT1 27 (SUTURE) ×1
SUT VIC AB 2-0 CT1 TAPERPNT 27 (SUTURE) ×1 IMPLANT
SYR 20ML LL LF (SYRINGE) ×2 IMPLANT
TAPE CLOTH 3X10 WHT NS LF (GAUZE/BANDAGES/DRESSINGS) ×2 IMPLANT
TAPE TRANSPORE STRL 2 31045 (GAUZE/BANDAGES/DRESSINGS) ×2 IMPLANT
TIP FAN IRRIG PULSAVAC PLUS (DISPOSABLE) ×1 IMPLANT
TOWEL OR 17X26 4PK STRL BLUE (TOWEL DISPOSABLE) ×2 IMPLANT
TRAY FOLEY MTR SLVR 16FR STAT (SET/KITS/TRAYS/PACK) ×2 IMPLANT

## 2020-12-24 NOTE — H&P (Signed)
The patient has been re-examined, and the chart reviewed, and there have been no interval changes to the documented history and physical.    The risks, benefits, and alternatives have been discussed at length. The patient expressed understanding of the risks benefits and agreed with plans for surgical intervention.  Broderic Bara P. Jammal Sarr, Jr. M.D.    

## 2020-12-24 NOTE — Anesthesia Preprocedure Evaluation (Signed)
Anesthesia Evaluation  Patient identified by MRN, date of birth, ID band Patient awake    Reviewed: Allergy & Precautions, H&P , NPO status , Patient's Chart, lab work & pertinent test results, reviewed documented beta blocker date and time   History of Anesthesia Complications Negative for: history of anesthetic complications  Airway Mallampati: II  TM Distance: >3 FB Neck ROM: full    Dental  (+) Lower Dentures, Upper Dentures, Edentulous Upper, Edentulous Lower, Dental Advidsory Given   Pulmonary neg pulmonary ROS, former smoker,           Cardiovascular Exercise Tolerance: Good hypertension, (-) angina(-) CAD, (-) Past MI, (-) Cardiac Stents and (-) CABG (-) dysrhythmias + Valvular Problems/Murmurs      Neuro/Psych negative neurological ROS  negative psych ROS   GI/Hepatic Neg liver ROS, GERD  Medicated and Controlled,  Endo/Other  negative endocrine ROS  Renal/GU negative Renal ROS  negative genitourinary   Musculoskeletal  (+) Arthritis , Osteoarthritis,    Abdominal   Peds negative pediatric ROS (+)  Hematology negative hematology ROS (+) anemia ,   Anesthesia Other Findings Past Medical History: No date: GERD (gastroesophageal reflux disease) No date: Glaucoma     Comment:  left eye No date: Heart murmur No date: Hypertension No date: Osteopenia   Reproductive/Obstetrics negative OB ROS                             Anesthesia Physical  Anesthesia Plan  ASA: II  Anesthesia Plan: Spinal   Post-op Pain Management:    Induction: Intravenous  PONV Risk Score and Plan: 3  Airway Management Planned: Nasal Cannula  Additional Equipment:   Intra-op Plan:   Post-operative Plan:   Informed Consent: I have reviewed the patients History and Physical, chart, labs and discussed the procedure including the risks, benefits and alternatives for the proposed anesthesia with the  patient or authorized representative who has indicated his/her understanding and acceptance.     Dental Advisory Given  Plan Discussed with: Anesthesiologist, CRNA and Surgeon  Anesthesia Plan Comments:         Anesthesia Quick Evaluation

## 2020-12-24 NOTE — Anesthesia Procedure Notes (Signed)
Spinal  Patient location during procedure: OR Start time: 12/24/2020 7:19 AM End time: 12/24/2020 7:24 AM Reason for block: surgical anesthesia Staffing Performed: resident/CRNA  Resident/CRNA: Nelda Marseille, CRNA Preanesthetic Checklist Completed: patient identified, IV checked, site marked, risks and benefits discussed, surgical consent, monitors and equipment checked, pre-op evaluation and timeout performed Spinal Block Patient position: sitting Prep: Betadine Patient monitoring: heart rate, continuous pulse ox, blood pressure and cardiac monitor Approach: midline Location: L3-4 Injection technique: single-shot Needle Needle type: Whitacre and Introducer  Needle gauge: 25 G Needle length: 9 cm Assessment Sensory level: T10 Events: CSF return Additional Notes Negative paresthesia. Negative blood return. Positive free-flowing CSF. Expiration date of kit checked and confirmed. Patient tolerated procedure well, without complications.

## 2020-12-24 NOTE — Evaluation (Signed)
Physical Therapy Evaluation Patient Details Name: Margaret Cannon MRN: [id number] DOB: 1941/02/15 Today's Date: 12/24/2020   History of Present Illness  Pt is a 80 y.o. female here for elective R THA posterior approach. PMH of: GERD, HTN, obesity, glaucoma, and osteopenia.    Clinical Impression  Pt admitted with above diagnosis. Pt received supine with HOB elevated with spouse and agreeable to PT. Pt reports minor pain in R hip. Pt educated on posterior hip precautions verbalizing understanding. Pt performed supine LE exercises with mod verbal cuing for correct form/technique. Mod-I with HOB elevated and use of bed rails to sit EOB with PT education on how to sit EOB without breaking post hip precautions. MinGuard provided for STS with RW and minguard for stand/pivot/step transfer to recliner with extra time to complete. At baseline, pt is Mod-I without household mobility and ADL's with strong at home support from spouse as needed. Pending pt progress, anticipate appropriateness will be HH PT. Pt currently with functional limitations due to the deficits listed below (see PT Problem List). Pt will benefit from skilled PT to increase their independence and safety with mobility to allow discharge to the venue listed below.       Follow Up Recommendations Follow surgeon's recommendation for DC plan and follow-up therapies;Home health PT;Supervision for mobility/OOB    Equipment Recommendations  None recommended by PT    Recommendations for Other Services       Precautions / Restrictions Precautions Precautions: Posterior Hip Restrictions Weight Bearing Restrictions: Yes RLE Weight Bearing: Weight bearing as tolerated      Mobility  Bed Mobility Overal bed mobility: Modified Independent             General bed mobility comments: Use of hospital rail and HOB elevated.    Transfers Overall transfer level: Modified independent Equipment used: Rolling walker (2  wheeled) Transfers: Sit to/from Stand Sit to Stand: Modified independent (Device/Increase time);Min guard         General transfer comment: Verbal cuing with standing maintaining posterior hip precautions  Ambulation/Gait Ambulation/Gait assistance:  (transfer to recliner)              Stairs            Wheelchair Mobility    Modified Rankin (Stroke Patients Only)       Balance Overall balance assessment: Needs assistance Sitting-balance support: Bilateral upper extremity supported Sitting balance-Leahy Scale: Fair     Standing balance support: Bilateral upper extremity supported Standing balance-Leahy Scale: Poor Standing balance comment: Required RW for support                             Pertinent Vitals/Pain Pain Assessment: 0-10 Pain Score: 2  Pain Location: R hip; posterior Pain Descriptors / Indicators: Aching Pain Intervention(s): Monitored during session;Ice applied;Repositioned    Home Living Family/patient expects to be discharged to:: Private residence Living Arrangements: Spouse/significant other Available Help at Discharge: Family Type of Home: House Home Access: Level entry     Home Layout: One [place]: Environmental consultant - 2 wheels;Cane - single point;Toilet riser      Prior Function Level of Independence: Independent         Comments: Husband performed all community based errands. Indep with household mobility and ADL's.     Hand Dominance        Extremity/Trunk Assessment   Upper Extremity Assessment Upper Extremity Assessment: Defer to OT evaluation  Lower Extremity Assessment Lower Extremity Assessment: Generalized weakness    Cervical / Trunk Assessment Cervical / Trunk Assessment: Normal  Communication   Communication: No difficulties  Cognition Arousal/Alertness: Awake/alert Behavior During Therapy: WFL for tasks assessed/performed Overall Cognitive Status: Within Functional Limits for  tasks assessed                                        General Comments      Exercises Total Joint Exercises Ankle Circles/Pumps: AROM;Both;20 reps;Supine Gluteal Sets: AROM;Strengthening;Both;10 reps Hip ABduction/ADduction: AROM;Strengthening;Right;10 reps (hip abduction, no adduction performed (didn't go past neutral))   Assessment/Plan    PT Assessment Patient needs continued PT services  PT Problem List Decreased strength;Decreased activity tolerance;Decreased safety awareness;Decreased mobility       PT Treatment Interventions DME instruction;Balance training;Gait training;Neuromuscular re-education;Stair training;Functional mobility training;Patient/family education;Therapeutic activities;Therapeutic exercise    PT Goals (Current goals can be found in the Care Plan section)  Acute Rehab PT Goals Patient Stated Goal: Go home PT Goal Formulation: With patient Time For Goal Achievement: 12/26/20 Potential to Achieve Goals: Good    Frequency BID   Barriers to discharge        Co-evaluation               AM-PAC PT "6 Clicks" Mobility  Outcome Measure Help needed turning from your back to your side while in a flat bed without using bedrails?: A Little Help needed moving from lying on your back to sitting on the side of a flat bed without using bedrails?: A Little Help needed moving to and from a bed to a chair (including a wheelchair)?: A Little Help needed standing up from a chair using your arms (e.g., wheelchair or bedside chair)?: A Little Help needed to walk in hospital room?: A Little Help needed climbing 3-5 steps with a railing? : A Lot 6 Click Score: 17    End of Session Equipment Utilized During Treatment: Gait belt Activity Tolerance: Patient tolerated treatment well;No increased pain Patient left: in chair;with call bell/phone within reach;with chair alarm set;with family/visitor present;with SCD's reapplied Nurse Communication:  Mobility status;Precautions PT Visit Diagnosis: Unsteadiness on feet (R26.81);Other abnormalities of gait and mobility (R26.89);Muscle weakness (generalized) (M62.81)    Time: 3536-1443 PT Time Calculation (min) (ACUTE ONLY): 19 min   Charges:   PT Evaluation $PT Eval Low Complexity: [place] M. Fairly IV, PT, DPT Physical Therapist- [facility]  12/24/2020, 4:41 PM

## 2020-12-24 NOTE — Op Note (Signed)
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 M.D.  ASSISTANT: Cassell Smiles, PA-C (present and scrubbed throughout the case, critical for assistance with exposure, retraction, instrumentation, and closure)  ANESTHESIA: spinal  ESTIMATED BLOOD LOSS: 150 mL  FLUIDS REPLACED: 2000 mL of crystalloid  DRAINS: 2 medium Hemovac drains  IMPLANTS UTILIZED: DePuy 12 mm small stature AML femoral stem, 50 mm OD Pinnacle 100 acetabular component, +4 mm degree Pinnacle Marathon polyethylene insert, and a 32 mm CoCr +1 mm hip ball  Note: Additional time and effort was expended during this case due to the patient's large body habitus and the protrusio.  Approximately 3-1/2 inches of adipose tissue was overlying the greater trochanter.  Special bariatric retractors and specialized instruments were required for visualization throughout the case and seating of the implants.  INDICATIONS FOR SURGERY: Margaret Cannon is a 80 y.o. year old female with a long history of progressive hip and groin  pain. X-rays demonstrated severe degenerative changes. The patient had not seen any significant improvement despite conservative nonsurgical intervention. After discussion of the risks and benefits of surgical intervention, the patient expressed understanding of the risks benefits and agree with plans for total hip arthroplasty.   The risks, benefits, and alternatives were discussed at length including but not limited to the risks of infection, bleeding, nerve injury, stiffness, blood clots, the need for revision surgery, limb length inequality, dislocation, cardiopulmonary complications, among others, and they were willing to  proceed.  PROCEDURE IN DETAIL: The patient was brought into the operating room and, after adequate spinal anesthesia was achieved, the patient was placed in a left lateral decubitus position. Axillary roll was placed and all bony prominences were well-padded. The patient's right hip was cleaned and prepped with alcohol and DuraPrep and draped in the usual sterile fashion. A "timeout" was performed as per usual protocol. A lateral curvilinear incision was made gently curving towards the posterior superior iliac spine. The IT band was incised in line with the skin incision and the fibers of the gluteus maximus were split in line. The piriformis tendon was identified, skeletonized, and incised at its insertion to the proximal femur and reflected posteriorly. A T type posterior capsulotomy was performed. Prior to dislocation of the femoral head, a threaded Steinmann pin was inserted through a separate stab incision into the pelvis superior to the acetabulum and bent in the form of a stylus so as to assess limb length and hip offset throughout the procedure. The femoral head was then dislocated posteriorly. Inspection of the femoral head demonstrated severe degenerative changes with full-thickness loss of articular cartilage. The femoral neck cut was performed using an oscillating saw. The anterior capsule was elevated off of the femoral neck using a periosteal elevator. Attention was then directed to the acetabulum. The remnant of the labrum was excised using electrocautery. Inspection of the acetabulum also demonstrated significant degenerative changes. The acetabulum was reamed in sequential fashion up to a 49 mm diameter. Good punctate bleeding bone was encountered. A 50 mm Pinnacle 100 acetabular component was positioned and impacted into place. Good scratch fit was appreciated. A neutral polyethylene trial was inserted.  Attention was then directed to the proximal femur. A hole for reaming of the proximal  femoral  canal was created using a high-speed burr. The femoral canal was reamed in sequential fashion up to a 11.5 mm diameter. This allowed for approximately 5 cm of scratch fit. Serial broaches were inserted up to a 12 mm small stature femoral broach. Calcar region was planed and a trial reduction was performed using a 32 mm hip ball with a +1 mm neck length.  There was slight increase in length and it was elected to remove the femoral implant and resect an additional 3 mm of bone from the calcar region.  12 mm small stature broach was reinserted with the 32 mm hip ball and a +1 mm neck segment.  The neutral polyethylene was exchanged and a +4 mm 10 degree trial liner was inserted with a high side at the 8 o'clock position.  Good equalization of limb lengths and hip offset was appreciated and excellent stability was noted both anteriorly and posteriorly. Trial components were removed. The acetabular shell was irrigated with copious amounts of normal saline with antibiotic solution and suctioned dry. A +4 mm 10 degree Pinnacle Marathon polyethylene insert was positioned with the high side at the 8 o'clock position and impacted into place. Next, a 12 mm small stature AML femoral stem was positioned and impacted into place. Excellent scratch fit was appreciated. A trial reduction was again performed with a 32 mm hip ball with a +1 mm neck length. Again, good equalization of limb lengths was appreciated and excellent stability appreciated both anteriorly and posteriorly. The hip was then dislocated and the trial hip ball was removed. The Morse taper was cleaned and dried. A 32 mm cobalt chrome hip ball with a +1 mm neck length was placed on the trunnion and impacted into place. The hip was then reduced and placed through range of motion. Excellent stability was appreciated both anteriorly and posteriorly.  The wound was irrigated with copious amounts of normal saline followed by 500 ml of Surgiphor and suctioned  dry. Good hemostasis was appreciated. The posterior capsulotomy was repaired using #5 Ethibond. Piriformis tendon was reapproximated to the undersurface of the gluteus medius tendon using #5 Ethibond. The IT band was reapproximated using interrupted sutures of #1 Vicryl. Subcutaneous tissue was approximated using first #0 Vicryl followed by #2-0 Vicryl. The skin was closed with skin staples.  The patient tolerated the procedure well and was transported to the recovery room in stable condition.   Marciano Sequin., M.D.

## 2020-12-24 NOTE — Anesthesia Procedure Notes (Signed)
Date/Time: 12/24/2020 8:51 AM Performed by: Nelda Marseille, CRNA Pre-anesthesia Checklist: Patient identified, Emergency Drugs available, Suction available, Patient being monitored and Timeout performed Oxygen Delivery Method: Simple face mask

## 2020-12-24 NOTE — Transfer of Care (Signed)
Immediate Anesthesia Transfer of Care Note  Patient: Margaret Cannon  Procedure(s) Performed: TOTAL HIP ARTHROPLASTY (Right Hip)  Patient Location: PACU  Anesthesia Type:Spinal  Level of Consciousness: awake and alert   Airway & Oxygen Therapy: Patient Spontanous Breathing and Patient connected to face mask oxygen  Post-op Assessment: Report given to RN and Post -op Vital signs reviewed and stable  Post vital signs: Reviewed and stable  Last Vitals:  Vitals Value Taken Time  BP 87/61 12/24/20 1230  Temp    Pulse 74 12/24/20 1232  Resp 18 12/24/20 1233  SpO2 97 % 12/24/20 1232  Vitals shown include unvalidated device data.  Last Pain:  Vitals:   12/24/20 0628  TempSrc: Temporal  PainSc: 9          Complications: No complications documented.

## 2020-12-25 ENCOUNTER — Encounter: Payer: Self-pay | Admitting: Orthopedic Surgery

## 2020-12-25 MED ORDER — SODIUM CHLORIDE 0.9 % IV BOLUS
500.0000 mL | Freq: Once | INTRAVENOUS | Status: AC
  Administered 2020-12-25: 500 mL via INTRAVENOUS

## 2020-12-25 NOTE — Care Management Obs Status (Signed)
[place] NOTIFICATION   Patient Details  Name: Margaret Cannon MRN: [id number] Date of Birth: 1941/02/28   Medicare Observation Status Notification Given:   Yes [id number]    Pete Pelt, RN 12/25/2020, 4:28 PM

## 2020-12-25 NOTE — Progress Notes (Signed)
  Subjective: 1 Day Post-Op Procedure(s) (LRB): TOTAL HIP ARTHROPLASTY (Right) Patient reports pain as well-controlled.   Patient is well, and has had no acute complaints or problems Plan is to go Home after hospital stay. Negative for  shortness of breath. Did have one episode of chest discomfort earlier that resolved.  Fever: no Gastrointestinal: negative for nausea and vomiting.\   Patient has not had a bowel movement.  Objective: Vital signs in last 24 hours: Temp:  [95.8 F (35.4 C)-98 F (36.7 C)] 97.5 F (36.4 C) (03/24 0457) Pulse Rate:  [54-70] 70 (03/24 0620) Resp:  [16-18] 17 (03/24 0457) BP: (101-135)/(45-67) 116/45 (03/24 0620) SpO2:  [94 %-100 %] 97 % (03/24 0457)  Intake/Output from previous day:  Intake/Output Summary (Last 24 hours) at 12/25/2020 0747 Last data filed at 12/25/2020 0306 Gross per 24 hour  Intake 4559.35 ml  Output 1300 ml  Net 3259.35 ml    Intake/Output this shift: No intake/output data recorded.  Labs: No results for input(s): HGB in the last 72 hours. No results for input(s): WBC, RBC, HCT, PLT in the last 72 hours. No results for input(s): NA, K, CL, CO2, BUN, CREATININE, GLUCOSE, CALCIUM in the last 72 hours. No results for input(s): LABPT, INR in the last 72 hours.   EXAM General - Patient is Alert, Appropriate and Oriented Extremity - Neurovascular intact Dorsiflexion/Plantar flexion intact Compartment soft; patient moderately tender to palpation over the costochondral joints Dressing/Incision -Postoperative dressing remains in place., Hemovac in place. No significant drainage  Motor Function - intact, moving foot and toes well on exam.  Cardiovascular- Regular rate and rhythm, no murmurs/rubs/gallops Respiratory- Lungs clear to auscultation bilaterally Gastrointestinal- soft, nontender and active bowel sounds   Assessment/Plan: 1 Day Post-Op Procedure(s) (LRB): TOTAL HIP ARTHROPLASTY (Right) Active Problems:   H/O total  hip arthroplasty  Estimated body mass index is 35.07 kg/m as calculated from the following:   Height as of this encounter: 5\' 3"  (1.6 m).   Weight as of this encounter: 89.8 kg. Advance diet Up with therapy  Possible d/c this PM pending appropriate progress with therapy   DVT Prophylaxis - Lovenox, Ted hose and foot pumps Weight-Bearing as tolerated to right leg  Cassell Smiles, PA-C [facility] Orthopaedic Surgery 12/25/2020, 7:47 AM

## 2020-12-25 NOTE — TOC Initial Note (Signed)
Transition of Care [facility]) - Initial/Assessment Note    Patient Details  Name: Margaret Cannon MRN: [id number] Date of Birth: 09-22-1941  Transition of [facility]) CM/SW Contact:    Pete Pelt, RN Phone Number: 12/25/2020, 4:34 PM  Clinical Narrative:      TOC in to see patient and husband at bedside.  Purpose of visit: Discharge planning, all amenable to discuss.  Patient to go home with [facility], arranged by MD office.  No concerns about transportation to appointments or medications as husband is home and able to fulfill needs.  No questions or concerns at this time, [facility] notified that patient will not discharge today.  TOC contact information given to patient and husband.  TOC will follow until discharge.             Expected Discharge Plan: [place] Barriers to Discharge: Continued Medical Work up   Patient Goals and CMS Choice     Choice offered to / list presented to : NA  Expected Discharge Plan and Services Expected Discharge Plan: [place] In-house Referral: NA   Post Acute Care Choice: [place] arrangements for the past 2 months: [place]: PT,OT [place] Agency: [facility] [employer] [facility] [employer] ([facility] arranged through MD office)        Prior Living Arrangements/Services Living arrangements for the past 2 months: [place] Lives with:: Spouse Patient language and need for interpreter reviewed:: Yes Do you feel safe going back to the place where you live?: Yes      Need for Family Participation in Patient Care: Yes (Comment) Care giver support system in place?: Yes (comment) (Spouse)   Criminal Activity/Legal Involvement Pertinent to Current Situation/Hospitalization: No - Comment as needed  Activities of Daily Living Home Assistive Devices/Equipment: Cane (specify quad or straight),Eyeglasses,Dentures (specify type) ADL  Screening (condition at time of admission) Patient's cognitive ability adequate to safely complete daily activities?: Yes Is the patient deaf or have difficulty hearing?: No Does the patient have difficulty seeing, even when wearing glasses/contacts?: No Does the patient have difficulty concentrating, remembering, or making decisions?: No Patient able to express need for assistance with ADLs?: Yes Does the patient have difficulty dressing or bathing?: No Independently performs ADLs?: Yes (appropriate for developmental age) Does the patient have difficulty walking or climbing stairs?: Yes Weakness of Legs: Right Weakness of Arms/Hands: None  Permission Sought/Granted Permission sought to share information with : Chartered certified accountant granted to share information with : Yes, Verbal Permission Granted     Permission granted to share info w AGENCY: [place]        Emotional Assessment Appearance:: Appears stated age Attitude/Demeanor/Rapport: Gracious,Engaged Affect (typically observed): Appropriate,Pleasant Orientation: : Oriented to Self,Oriented to Place,Oriented to  Time,Oriented to Situation Alcohol / Substance Use: Not Applicable Psych Involvement: No (comment)  Admission diagnosis:  H/O total hip arthroplasty [Z96.649] Patient Active Problem List   Diagnosis Date Noted  . H/O total hip arthroplasty 12/24/2020  . Primary osteoarthritis of right hip 09/25/2020  . Obesity 09/03/2020  . Atopic dermatitis 04/16/2020  . Routine general medical examination at health care facility 09/16/2019  . 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 Other specified disorders of veins 06/29/2016  . Osteopenia 08/13/2014  . Tinea versicolor 01/13/2014  .  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 Abdominal pain, other specified site 07/13/2012  . Anemia 07/12/2012  . Chronic leg pain 07/12/2012  . GERD (gastroesophageal reflux disease) 07/12/2012  . HTN  (hypertension) 07/12/2012   PCP:  Sharyne Peach, MD Pharmacy:   [facility] [address]), [place] [address] [place]) [place] [address] Phone: [phone] Fax: [phone]     Social Determinants of Health (SDOH) Interventions    Readmission Risk Interventions No flowsheet data found.

## 2020-12-25 NOTE — Evaluation (Signed)
Occupational Therapy Evaluation Patient Details Name: Margaret Cannon MRN: [id number] DOB: [birth year] Today's Date: 12/25/2020    History of Present Illness Pt is a 80 y.o. female here for elective R THA posterior approach. PMH of: GERD, HTN, obesity, glaucoma, and osteopenia.   Clinical Impression   Pt seen for OT evaluation this date, POD#1 from above surgery. Pt was independent in all ADLs prior to surgery, however occasionally using SPC for mobility due to R hip pain. Pt is eager to return to PLOF with less pain and improved safety and independence. Pt currently requires Mod A for LB dressing while in seated position due to pain and limited AROM of R hip. Pt able to recall 2/5 posterior total hip precautions at start of session and unable to verbalize how to implement during ADL and mobility. Pt instructed in posterior total hip precautions and how to implement self care skills, falls prevention strategies, home/routines modifications, DME/AE for LB bathing and dressing tasks, compression stocking mgt strategies, and car transfer techniques. At end of session, pt able to recall 5/5 posterior total hip precautions. While hospitalized, pt would benefit from additional instruction in self care skills and techniques to help maintain precautions with or without assistive devices to support recall and carryover prior to discharge. Recommend HHOT upon discharge.      Follow Up Recommendations  Home health OT    Equipment Recommendations       Recommendations for Other Services       Precautions / Restrictions Precautions Precautions: Posterior Hip Precaution Booklet Issued: Yes (comment) Precaution Comments: orthostatic hypotension Restrictions Weight Bearing Restrictions: Yes RLE Weight Bearing: Weight bearing as tolerated      Mobility Bed Mobility Overal bed mobility: Needs Assistance Bed Mobility: Sit to Supine;Supine to Sit     Supine to sit: Supervision Sit to  supine: Min guard   General bed mobility comments: able to sit EOB 5+ minutes, although with mild/mod dizziness    Transfers                 General transfer comment: Do not attempt, 2/2 dizziness, low BP    Balance Overall balance assessment: Needs assistance Sitting-balance support: Bilateral upper extremity supported Sitting balance-Leahy Scale: Good Sitting balance - Comments: Able to sit EOB ~ 5 minutes, endorses mild/mod dizziness                                   ADL either performed or assessed with clinical judgement   ADL Overall ADL's : Needs assistance/impaired Eating/Feeding: Independent                   Lower Body Dressing: Moderate assistance                       Vision Patient Visual Report: No change from baseline       Perception     Praxis      Pertinent Vitals/Pain Pain Assessment: 0-10 Pain Score: 1  Pain Location: R hip; posterior Pain Descriptors / Indicators: Aching Pain Intervention(s): Limited activity within patient's tolerance;Repositioned;Premedicated before session;Monitored during session     Hand Dominance     Extremity/Trunk Assessment Upper Extremity Assessment Upper Extremity Assessment: Overall WFL for tasks assessed   Lower Extremity Assessment Lower Extremity Assessment: RLE deficits/detail RLE Deficits / Details: weakness, limited ROM 2/2 R THA RLE Sensation: WNL  Communication Communication Communication: No difficulties   Cognition Arousal/Alertness: Awake/alert Behavior During Therapy: WFL for tasks assessed/performed Overall Cognitive Status: Within Functional Limits for tasks assessed                                     General Comments       Exercises Total Joint Exercises Ankle Circles/Pumps: AROM;Both;10 reps Quad Sets: AROM;Both;10 reps Short Arc Quad: AROM;10 reps;Both Other Exercises Other Exercises: Orthostatic vitals assessed due to pt  reporting SOB as well as fatigue after transferring to commode. Semi supine: 94/82 (68). seated EOB 76/61 (78), and 124/57 (70) after returned to supine in trendelenburg position. Other Exercises: Provided educ re: hip precautions, AE for LB dressing/bathing   Shoulder Instructions      Home Living Family/patient expects to be discharged to:: Private residence Living Arrangements: Spouse/significant other Available Help at Discharge: Family Type of Home: House Home Access: Stairs to enter Technical brewer of Steps: 1   Home Layout: One level     Bathroom Shower/Tub: Teacher, early years/pre: Handicapped height     Home Equipment: Environmental consultant - 2 wheels;Cane - single point;Toilet riser   Additional Comments: neither grab bars nor seat in shower/tub combo; pt will evaluate as she recovers whether she should add those safety devices      Prior Functioning/Environment Level of Independence: Independent        Comments: Husband performed all community based errands. Indep with household mobility and ADL's.        OT Problem List: Decreased strength;Decreased range of motion;Decreased activity tolerance;Impaired balance (sitting and/or standing);Decreased knowledge of use of DME or AE;Decreased knowledge of precautions      OT Treatment/Interventions: Self-care/ADL training;Therapeutic exercise;Patient/family education;Balance training;Energy conservation;Therapeutic activities;DME and/or AE instruction    OT Goals(Current goals can be found in the care plan section) Acute Rehab OT Goals Patient Stated Goal: Go home OT Goal Formulation: With patient/family Time For Goal Achievement: 01/08/21 Potential to Achieve Goals: Good ADL Goals Pt Will Transfer to Toilet: with supervision (using LRAD) Pt Will Perform Toileting - Clothing Manipulation and hygiene: with supervision;sit to/from stand (w/ AE as needed) Additional ADL Goal #1: Pt will be able to recall/describe  3+ hip precautions  OT Frequency: Min 1X/week   Barriers to D/C:            Co-evaluation              AM-PAC OT "6 Clicks" Daily Activity     Outcome Measure Help from another person eating meals?: None Help from another person taking care of personal grooming?: None Help from another person toileting, which includes using toliet, bedpan, or urinal?: A Lot Help from another person bathing (including washing, rinsing, drying)?: A Little Help from another person to put on and taking off regular upper body clothing?: None Help from another person to put on and taking off regular lower body clothing?: A Lot 6 Click Score: 19   End of Session    Activity Tolerance: Patient tolerated treatment well (pt limited by dizziness, orthostatic hypotension) Patient left: in bed;with family/visitor present;with call bell/phone within reach;with bed alarm set;with nursing/sitter in room  OT Visit Diagnosis: Unsteadiness on feet (R26.81);Muscle weakness (generalized) (M62.81);Other abnormalities of gait and mobility (R26.89)                Time: 1145-1210 OT Time Calculation (min): 25 min Charges:  OT  General Charges $OT Visit: 1 Visit OT Evaluation $OT Eval Low Complexity: 1 Low OT Treatments $Self Care/Home Management : 23-37 mins  Josiah Lobo, PhD, MS, OTR/L 12/25/20, 1:38 PM

## 2020-12-25 NOTE — Progress Notes (Signed)
Physical Therapy Treatment Patient Details Name: Margaret Cannon MRN: [id number] DOB: 1941/03/11 Today's Date: 12/25/2020    History of Present Illness Pt is a 80 y.o. female here for elective R THA posterior approach. PMH of: GERD, HTN, obesity, glaucoma, and osteopenia.    PT Comments    Patient alert, in bed after recently returning to bed from [facility]. Pt reported feeling SOB and very fatigued from attempt. Orthostatic vitals assessed, + and session limited due to this. Pt able to transfer to EOB for BP assessment modI, but returned to supine maxA due to symptoms. BP WFLs and pt reported that she was willing to perform a few exercises, in supine with verbal cues, no physical assistance needed. RN and MD notified of pt status and limited session, PT to re-attempt in PM to progress pt as able.     Follow Up Recommendations  Follow surgeon's recommendation for DC plan and follow-up therapies;Home health PT;Supervision for mobility/OOB     Equipment Recommendations  None recommended by PT    Recommendations for Other Services       Precautions / Restrictions Precautions Precautions: Posterior Hip Precaution Booklet Issued: Yes (comment) Restrictions Weight Bearing Restrictions: Yes RLE Weight Bearing: Weight bearing as tolerated    Mobility  Bed Mobility Overal bed mobility: Modified Independent;Needs Assistance Bed Mobility: Sit to Supine       Sit to supine: Max assist   General bed mobility comments: Use of hospital rail and HOB elevated. pt sat EOB for a 1.5 minutes, complained of light headedness, appeared very symptomatic. returned to supine maxA    Transfers                    Ambulation/Gait                 Stairs             Wheelchair Mobility    Modified Rankin (Stroke Patients Only)       Balance Overall balance assessment: Needs assistance Sitting-balance support: Bilateral upper extremity supported Sitting  balance-Leahy Scale: Fair                                      Cognition Arousal/Alertness: Awake/alert Behavior During Therapy: WFL for tasks assessed/performed Overall Cognitive Status: Within Functional Limits for tasks assessed                                        Exercises Total Joint Exercises Ankle Circles/Pumps: AROM;Both;10 reps Quad Sets: AROM;Both;10 reps Short Arc Quad: AROM;Strengthening;Right;10 reps Other Exercises Other Exercises: Orthostatic vitals assessed due to pt reporting SOB as well as fatigue after transferring to commode. Semi supine: 94/82 (68). seated EOB 76/61 (78), and 124/57 (70) after returned to supine in trendelenburg position.    General Comments        Pertinent Vitals/Pain Pain Assessment: 0-10 Pain Score: 2  Pain Location: R hip; posterior Pain Descriptors / Indicators: Aching Pain Intervention(s): Limited activity within patient's tolerance;Repositioned    Home Living                      Prior Function            PT Goals (current goals can now be found in the care plan section) Progress towards  PT goals: Progressing toward goals    Frequency    BID      PT Plan Current plan remains appropriate    Co-evaluation              AM-PAC PT "6 Clicks" Mobility   Outcome Measure  Help needed turning from your back to your side while in a flat bed without using bedrails?: A Little Help needed moving from lying on your back to sitting on the side of a flat bed without using bedrails?: A Little Help needed moving to and from a bed to a chair (including a wheelchair)?: A Little Help needed standing up from a chair using your arms (e.g., wheelchair or bedside chair)?: A Little Help needed to walk in hospital room?: A Little Help needed climbing 3-5 steps with a railing? : A Lot 6 Click Score: 17    End of Session Equipment Utilized During Treatment: Gait belt Activity Tolerance:  Treatment limited secondary to medical complications (Comment);Other (comment) (limited by BP) Patient left: in bed;with call bell/phone within reach;with bed alarm set Nurse Communication: Mobility status;Precautions PT Visit Diagnosis: Unsteadiness on feet (R26.81);Other abnormalities of gait and mobility (R26.89);Muscle weakness (generalized) (M62.81)     Time: 4142-3953 PT Time Calculation (min) (ACUTE ONLY): 23 min  Charges:  $Therapeutic Exercise: 8-22 mins $Therapeutic Activity: 8-22 mins                     Lieutenant Diego PT, DPT 11:02 AM,12/25/20

## 2020-12-25 NOTE — Progress Notes (Signed)
Physical Therapy Treatment Patient Details Name: Margaret Cannon MRN: [id number] DOB: Sep 03, 1941 Today's Date: 12/25/2020    History of Present Illness Pt is a 80 y.o. female here for elective R THA posterior approach. PMH of: GERD, HTN, obesity, glaucoma, and osteopenia.    PT Comments    Pt with mild pain in R hip, session still limited by low BP and pt symptoms but able to tolerate more activities this AM. BP in sitting 111/49, standing initial 127/66, and then 101/48 after 3 minutes, pt unable to stand for full three minutes due to "swimmyheadedness". Exercises performed throughout session with verbal and tactile cues in supine and in sitting. Bed mobility modI, and sit <> Stand twice with RW and supervision/CGA. Up to chair with 1-2 steps in room and semi reclined due to symptoms, family and all needs in reach, BP 101/48 at end of session. The patient would benefit from further skilled PT intervention to continue to progress towards goals. Recommendation remains appropriate.       Follow Up Recommendations  Follow surgeon's recommendation for DC plan and follow-up therapies;Home health PT;Supervision for mobility/OOB     Equipment Recommendations  None recommended by PT    Recommendations for Other Services       Precautions / Restrictions Precautions Precautions: Posterior Hip Precaution Booklet Issued: Yes (comment) Precaution Comments: orthostatic hypotension Restrictions Weight Bearing Restrictions: Yes RLE Weight Bearing: Weight bearing as tolerated    Mobility  Bed Mobility Overal bed mobility: Needs Assistance Bed Mobility: Supine to Sit     Supine to sit: Supervision Sit to supine: Min guard   General bed mobility comments: able to sit EOB And perform some exercises, BP Assessed twice. Low but still capable of sitting and transferring    Transfers Overall transfer level: Modified independent Equipment used: Rolling walker (2 wheeled) Transfers:  Sit to/from Stand Sit to Stand: Min guard         General transfer comment: able to to stand ~2 minutes but had to sit prior to transferring to chair  Ambulation/Gait Ambulation/Gait assistance: Min guard Gait Distance (Feet): 2 Feet Assistive device: Rolling walker (2 wheeled)           Stairs             Wheelchair Mobility    Modified Rankin (Stroke Patients Only)       Balance Overall balance assessment: Needs assistance Sitting-balance support: Bilateral upper extremity supported Sitting balance-Leahy Scale: Good Sitting balance - Comments: able to sit for several minutes   Standing balance support: Bilateral upper extremity supported Standing balance-Leahy Scale: Poor Standing balance comment: Required RW for support                            Cognition Arousal/Alertness: Awake/alert Behavior During Therapy: WFL for tasks assessed/performed Overall Cognitive Status: Within Functional Limits for tasks assessed                                        Exercises Total Joint Exercises Ankle Circles/Pumps: AROM;Both;20 reps Quad Sets: AROM;Both;10 reps Gluteal Sets: AROM;Strengthening;Both;10 reps Short Arc Quad: AROM;10 reps;Right Heel Slides: AROM;Strengthening;Both;10 reps Hip ABduction/ADduction: AROM;Strengthening;Right;10 reps Long Arc Quad: AROM;Strengthening;10 reps;Both Other Exercises Other Exercises: orthostatic:sit 111/49 (84),  stand127/66 (87), stand 120/56 (75) Other Exercises: BP assessed several times. 101/48 sitting in chair at end of session  General Comments        Pertinent Vitals/Pain Pain Assessment: Faces Pain Score: 1  Faces Pain Scale: Hurts a little bit Pain Location: R hip; posterior Pain Descriptors / Indicators: Aching;Guarding;Grimacing Pain Intervention(s): Limited activity within patient's tolerance;Monitored during session;Premedicated before session;Repositioned    Home Living  Family/patient expects to be discharged to:: Private residence Living Arrangements: Spouse/significant other Available Help at Discharge: Family Type of Home: House Home Access: Stairs to enter   Home Layout: One level Home Equipment: Environmental consultant - 2 wheels;Cane - single point;Toilet riser Additional Comments: neither grab bars nor seat in shower/tub combo; pt will evaluate as she recovers whether she should add those safety devices    Prior Function Level of Independence: Independent      Comments: Husband performed all community based errands. Indep with household mobility and ADL's.   PT Goals (current goals can now be found in the care plan section) Acute Rehab PT Goals Patient Stated Goal: Go home Progress towards PT goals: Progressing toward goals    Frequency    BID      PT Plan Current plan remains appropriate    Co-evaluation              AM-PAC PT "6 Clicks" Mobility   Outcome Measure  Help needed turning from your back to your side while in a flat bed without using bedrails?: A Little Help needed moving from lying on your back to sitting on the side of a flat bed without using bedrails?: A Little Help needed moving to and from a bed to a chair (including a wheelchair)?: A Little Help needed standing up from a chair using your arms (e.g., wheelchair or bedside chair)?: A Little Help needed to walk in hospital room?: A Little Help needed climbing 3-5 steps with a railing? : A Lot 6 Click Score: 17    End of Session Equipment Utilized During Treatment: Gait belt Activity Tolerance: Treatment limited secondary to medical complications (Comment);Other (comment) (limited by BP) Patient left: with call bell/phone within reach;with chair alarm set;in chair;with family/visitor present Nurse Communication: Mobility status;Precautions PT Visit Diagnosis: Unsteadiness on feet (R26.81);Other abnormalities of gait and mobility (R26.89);Muscle weakness (generalized)  (M62.81)     Time: 7680-8811 PT Time Calculation (min) (ACUTE ONLY): 32 min  Charges:  $Therapeutic Exercise: 8-22 mins $Therapeutic Activity: 8-22 mins                     Lieutenant Diego PT, DPT 4:29 PM,12/25/20

## 2020-12-25 NOTE — Anesthesia Postprocedure Evaluation (Signed)
Anesthesia Post Note  Patient: Margaret Cannon  Procedure(s) Performed: TOTAL HIP ARTHROPLASTY (Right Hip)  Patient location during evaluation: Nursing Unit Anesthesia Type: Spinal Level of consciousness: awake, awake and alert and oriented Pain management: pain level controlled Vital Signs Assessment: post-procedure vital signs reviewed and stable Respiratory status: spontaneous breathing, nonlabored ventilation and respiratory function stable Cardiovascular status: stable Postop Assessment: no headache, no backache, no apparent nausea or vomiting, patient able to bend at knees and adequate PO intake Anesthetic complications: no   No complications documented.   Last Vitals:  Vitals:   12/25/20 0457 12/25/20 0620  BP: (!) 101/45 (!) 116/45  Pulse: 66 70  Resp: 17   Temp: (!) 36.4 C   SpO2: 97%     Last Pain:  Vitals:   12/25/20 0014  TempSrc:   PainSc: 2                  Jenee Spaugh,  [facility]

## 2020-12-25 NOTE — Progress Notes (Signed)
PT Cancellation Note  Patient Details Name: Margaret Cannon MRN: [id number] DOB: 1941-04-13   Cancelled Treatment:    Reason Eval/Treat Not Completed: Other (comment). PT in room to assess pt, pt reported still feeling not well, BP assessed and 96/40. PT to-reattempt later in PM.   Lieutenant Diego PT, DPT 1:27 PM,12/25/20

## 2020-12-25 NOTE — Progress Notes (Signed)
Patient bladder scanned at 2239. 560 noted. I&O cath done at 0028. 700 mL drained from bladder.

## 2020-12-26 ENCOUNTER — Observation Stay: Payer: Medicare Other

## 2020-12-26 DIAGNOSIS — Z96641 Presence of right artificial hip joint: Secondary | ICD-10-CM | POA: Diagnosis not present

## 2020-12-26 DIAGNOSIS — Z6835 Body mass index (BMI) 35.0-35.9, adult: Secondary | ICD-10-CM

## 2020-12-26 DIAGNOSIS — I10 Essential (primary) hypertension: Secondary | ICD-10-CM

## 2020-12-26 DIAGNOSIS — J189 Pneumonia, unspecified organism: Secondary | ICD-10-CM | POA: Diagnosis not present

## 2020-12-26 LAB — SURGICAL PATHOLOGY

## 2020-12-26 LAB — CBC
HCT: 26.7 % — ABNORMAL LOW (ref 36.0–46.0)
Hemoglobin: 8.9 g/dL — ABNORMAL LOW (ref 12.0–15.0)
MCH: 28.1 pg (ref 26.0–34.0)
MCHC: 33.3 g/dL (ref 30.0–36.0)
MCV: 84.2 fL (ref 80.0–100.0)
Platelets: 178 10*3/uL (ref 150–400)
RBC: 3.17 MIL/uL — ABNORMAL LOW (ref 3.87–5.11)
RDW: 14.2 % (ref 11.5–15.5)
WBC: 13.2 10*3/uL — ABNORMAL HIGH (ref 4.0–10.5)
nRBC: 0 % (ref 0.0–0.2)

## 2020-12-26 MED ORDER — SENNOSIDES-DOCUSATE SODIUM 8.6-50 MG PO TABS
1.0000 | ORAL_TABLET | Freq: Every day | ORAL | Status: DC
  Administered 2020-12-27: 1 via ORAL
  Filled 2020-12-26: qty 1

## 2020-12-26 MED ORDER — SODIUM CHLORIDE 0.9 % IV SOLN
500.0000 mg | INTRAVENOUS | Status: DC
  Administered 2020-12-26 – 2020-12-27 (×2): 500 mg via INTRAVENOUS
  Filled 2020-12-26 (×3): qty 500

## 2020-12-26 MED ORDER — SODIUM CHLORIDE 0.9 % IV SOLN
1.0000 g | INTRAVENOUS | Status: DC
  Administered 2020-12-26 – 2020-12-27 (×2): 1 g via INTRAVENOUS
  Filled 2020-12-26 (×3): qty 10

## 2020-12-26 NOTE — Consult Note (Signed)
Internal Medicine Consult Note  Margaret Cannon IWP:[id number] DOB: [birth year] DOA: 12/24/2020  PCP: Sharyne Peach, MD  Outpatient Specialists: Dr. Skip Estimable, orthopedic surgery  I have personally briefly reviewed patient's old medical records in [place].  Chief Concern: Cough  HPI: Margaret Cannon is a 80 y.o. female with medical history significant for hypertension, GERD, truncal obesity, chronic leg pain, history of atopic dermatitis, varicose veins, hyperlipidemia, atrial fibrillation on Eliquis 5 mg twice daily, admitted to orthopedic service for total right hip arthroplasty.  She is status post total right hip arthroplasty on 12/24/2020.  Margaret Cannon has been doing well and participating with physical therapy.  Per patient, patient uses stairs with physical therapy today.  On orthopedic service rounds, patient endorsed cough that is productive.  She denies fever.  Orthopedic team ordered portable chest x-ray which showed multilobar pneumonia.  Medicine was consulted for pneumonia.  At bedside, patient was able to tell me her name, her age, location of hospital.  She does not appear to be in acute distress.  She reports that she has been coughing for 2 days and states that the cough is productive with brown sputum.  She states that this is not normal for her.  She is not using accessory muscles and does not appear to be in acute distress.  She endorses subjective chills.  She denies fever.  She states that she gets cold a lot.  She denies chest pain, shortness of breath, dysuria, hematuria, poor appetite, abdominal pain, headache, vision changes, nausea, vomiting.  Vaccination: Moderna vaccines 11/10/2019, 12/08/2019, 08/08/2020  ROS: Constitutional: no weight change, no fever ENT/Mouth: no sore throat, no rhinorrhea Eyes: no eye pain, no vision changes Cardiovascular: no chest pain, no dyspnea,  no edema, no palpitations Respiratory: + cough, + sputum, no  wheezing Gastrointestinal: no nausea, no vomiting, no diarrhea, no constipation Genitourinary: no urinary incontinence, no dysuria, no hematuria Musculoskeletal: no arthralgias, no myalgias Skin: no skin lesions, no pruritus, Neuro: + weakness, no loss of consciousness, no syncope Psych: no anxiety, no depression, + decrease appetite Heme/Lymph: no bruising, no bleeding  Hospital course:   Vitals in the hospital has been afebrile, with respiration rate of 19, heart rate of 94, blood pressure 142/71, patient satting at 98% on room air.  Patient received cefazolin pre and postop.  Assessment/Plan  Principal Problem:   Pneumonia Active Problems:   Atopic dermatitis   Chronic leg pain   GERD (gastroesophageal reflux disease)   HTN (hypertension)   Obesity   H/O total hip arthroplasty   Pneumonia-ceftriaxone and azithromycin IV Leukocytosis-secondary to pneumonia Does not meet sepsis criteria -Can be converted to p.o. once patient is ready for discharge -Checking SARS COVID-19 as pneumonia is multilobar on chest x-ray -CBC, BMP in the a.m. -Hospitalist service will follow up  Status post total right hip arthroplasty-pain management per primary orthopedic team -Dilaudid, oxycodone, tramadol, acetaminophen  Hyperlipidemia-pravastatin 10 mg daily per primary team  Hypertension-Per primary team as resumed triamterene-hydrochlorothiazide 1 tablet daily  History of atrial fibrillation-on home Eliquis 5 mg twice daily, this is being held due to recent hip arthroplasty  PT/OT per primary team  Chart reviewed.   DVT prophylaxis: Lovenox per primary team Code Status: Full code Diet: Per primary team Disposition Plan: Per primary team  Past Medical History:  Diagnosis Date  . GERD (gastroesophageal reflux disease)   . Glaucoma    left eye  . Heart murmur   . Hypertension   .  Osteopenia    Past Surgical History:  Procedure Laterality Date  . CARDIAC CATHETERIZATION     . COLONOSCOPY WITH PROPOFOL N/A 07/05/2018   Procedure: COLONOSCOPY WITH PROPOFOL;  Surgeon: Toledo, Benay Pike, MD;  Location: [facility] END[facility]COPY;  Service: Gastroenterology;  Laterality: N/A;  . ESOPHAGOGASTRODUODENOSCOPY (EGD) WITH PROPOFOL N/A 07/05/2018   Procedure: ESOPHAGOGASTRODUODENOSCOPY (EGD) WITH PROPOFOL;  Surgeon: Toledo, Benay Pike, MD;  Location: [facility] ENDOSCOPY;  Service: Gastroenterology;  Laterality: N/A;  . EUS  07/13/2012   Procedure: UPPER ENDOSCOPIC ULTRASOUND (EUS) LINEAR;  Surgeon: Milus Banister, MD;  Location: [facility]COPY;  Service: Endoscopy;  Laterality: N/A;  radial linear    . TOTAL HIP ARTHROPLASTY Right 12/24/2020   Procedure: TOTAL HIP ARTHROPLASTY;  Surgeon: Dereck Leep, MD;  Location: [facility] ORS;  Service: Orthopedics;  Laterality: Right;   Social History:  reports that she quit smoking about 11 years ago. She has never used smokeless tobacco. She reports that she does not drink alcohol and does not use drugs.  No Known Allergies Family History  Problem Relation Age of Onset  . Breast cancer Neg Hx    Family history: Family history reviewed and not pertinent  Prior to Admission medications   Medication Sig Start Date End Date Taking? Authorizing Provider  acetaminophen (TYLENOL) 650 MG CR tablet Take 650 mg by mouth every 8 (eight) hours as needed for pain.   Yes [provider]  Calcium Carb-Cholecalciferol (CALCIUM 600 + D PO) Take 1 tablet by mouth daily.   Yes [provider]  Homeopathic Products ([place] EX) Apply 1 application topically daily as needed (pain).   Yes [provider]  lovastatin (MEVACOR) 10 MG tablet Take 10 mg by mouth daily. 08/26/20  Yes [provider]  Menthol-Methyl Salicylate (MUSCLE RUB EX) Apply 1 application topically daily as needed (pain).   Yes [provider]  omeprazole (PRILOSEC OTC) 20 MG tablet Take 20 mg by mouth daily.   Yes [provider]  Polyethyl  Glycol-Propyl Glycol (SYSTANE OP) Place 1 drop into both eyes daily as needed (dry eyes).   Yes [provider]  triamcinolone (KENALOG) 0.1 % Apply 1 application topically 2 (two) times daily as needed (dry skin).   Yes [provider]  triamterene-hydrochlorothiazide (DYAZIDE) 37.5-25 MG per capsule Take 1 capsule by mouth every morning.   Yes [provider]   Physical Exam: Vitals:   12/26/20 0735 12/26/20 1143 12/26/20 1511 12/26/20 1937  BP: (!) 125/46 (!) 120/48 (!) 142/71 (!) 120/55  Pulse: 94 80 94 (!) 102  Resp: 19 18 19 17   Temp: 98.5 F (36.9 C) 98.1 F (36.7 C) 97.8 F (36.6 C) 97.9 F (36.6 C)  TempSrc:      SpO2: 94% 98% 98% 98%  Weight:      Height:       Constitutional: appears age-appropriate, NAD, calm, comfortable Eyes: PERRL, lids and conjunctivae normal ENMT: Mucous membranes are moist. Posterior pharynx clear of any exudate or lesions. Age-appropriate dentition. Hearing appropriate Neck: normal, supple, no masses, no thyromegaly Respiratory: Decreased lung sounds in the left lobe. Normal respiratory effort. No accessory muscle use.  Cardiovascular: Regular rate and rhythm, no murmurs / rubs / gallops. No extremity edema. 2+ pedal pulses. No carotid bruits.  Abdomen: Morbidly obese abdomen, no tenderness, no masses palpated, no hepatosplenomegaly. Bowel sounds positive.  Musculoskeletal: no clubbing / cyanosis. No joint deformity upper and lower extremities. Good ROM, no contractures, no atrophy. Normal muscle tone.  Skin: no rashes, lesions, ulcers. No induration Neurologic: Sensation intact. Strength 5/5 in all 4.  Psychiatric: Normal judgment and insight. Alert and oriented x 3. Normal mood.   EKG: Not indicated  Chest x-ray: I personally reviewed and I agree with radiologist reading as below.  DG Chest 2 View  Result Date: 12/26/2020 CLINICAL DATA:  Lung crackles EXAM: CHEST - 2 VIEW COMPARISON:  03/06/2006 chest radiograph.  FINDINGS: Stable cardiomediastinal silhouette with mild cardiomegaly and moderate hiatal hernia. No pneumothorax. Trace bilateral pleural effusions. Extensive patchy consolidation throughout the left lung. Clear right lung. IMPRESSION: Extensive patchy consolidation throughout the left lung, most compatible with multilobar pneumonia. Recommend follow-up chest radiographs to resolution. Mild cardiomegaly.  Trace bilateral pleural effusions. Moderate hiatal hernia. Electronically Signed   By: Ilona Sorrel M.D.   On: 12/26/2020 09:45   Labs on Admission: I have personally reviewed following labs  CBC: Recent Labs  Lab 12/26/20 0935  WBC 13.2*  HGB 8.9*  HCT 26.7*  MCV 84.2  PLT 701   Basic Metabolic Panel: No results for input(s): NA, K, CL, CO2, GLUCOSE, BUN, CREATININE, CALCIUM, MG, PHOS in the last 168 hours. GFR: Estimated Creatinine Clearance: 50.1 mL/min (by C-G formula based on SCr of 1 mg/dL).  Urine analysis:    Component Value Date/Time   COLORURINE YELLOW (A) 12/16/2020 1356   APPEARANCEUR CLEAR (A) 12/16/2020 1356   LABSPEC 1.010 12/16/2020 1356   PHURINE 7.0 12/16/2020 1356   GLUCOSEU NEGATIVE 12/16/2020 1356   HGBUR SMALL (A) 12/16/2020 1356   BILIRUBINUR NEGATIVE 12/16/2020 1356   KETONESUR NEGATIVE 12/16/2020 1356   PROTEINUR NEGATIVE 12/16/2020 1356   NITRITE NEGATIVE 12/16/2020 1356   LEUKOCYTESUR NEGATIVE 12/16/2020 1356   Ripken Rekowski N Jaice Digioia D.O. [facility]  If 7PM-7AM, please contact overnight-coverage provider If 7AM-7PM, please contact day coverage provider [email or web address]  12/26/2020, 9:47 PM

## 2020-12-26 NOTE — Progress Notes (Signed)
Physical Therapy Treatment Patient Details Name: Margaret Cannon MRN: [id number] DOB: 1941/03/14 Today's Date: 12/26/2020    History of Present Illness Pt is a 80 y.o. female here for elective R THA posterior approach. PMH of: GERD, HTN, obesity, glaucoma, and osteopenia.    PT Comments    Pt alert, in bed, agreeable to PT, denied pain. Pt performed bed mobility and sit <> stand with RW modI. Ambulated ~46ft after stair navigation, improved  Cadence and confidence noted. Stair training also performed with CGA, family/pt able to recall and teach back safe technique for stairs. Returned to room with all needs in reach. The patient would benefit from further skilled PT intervention to continue to progress towards goals. Recommendation remains appropriate.    Follow Up Recommendations  Follow surgeon's recommendation for DC plan and follow-up therapies;Home health PT;Supervision for mobility/OOB     Equipment Recommendations  None recommended by PT    Recommendations for Other Services       Precautions / Restrictions Precautions Precautions: Posterior Hip Precaution Booklet Issued: Yes (comment) Precaution Comments: watch BP Restrictions Weight Bearing Restrictions: Yes RLE Weight Bearing: Weight bearing as tolerated    Mobility  Bed Mobility Overal bed mobility: Modified Independent                  Transfers Overall transfer level: Modified independent Equipment used: Rolling walker (2 wheeled) Transfers: Sit to/from Stand Sit to Stand: Modified independent (Device/Increase time)            Ambulation/Gait Ambulation/Gait assistance: Min guard Gait Distance (Feet): 75 Feet Assistive device: Rolling walker (2 wheeled)       General Gait Details: reciprocal gait pattern noted, no LOB, improved safety noted this PM As well as confidence with ambulation   Stairs Stairs: Yes Stairs assistance: Min guard Stair Management: Forwards;Step to  pattern;With walker Number of Stairs: 1 General stair comments: cued for technique, CGA for safety, assist to place RW up on step   Wheelchair Mobility    Modified Rankin (Stroke Patients Only)       Balance Overall balance assessment: Needs assistance Sitting-balance support: Bilateral upper extremity supported Sitting balance-Leahy Scale: Good     Standing balance support: Bilateral upper extremity supported Standing balance-Leahy Scale: Fair Standing balance comment: pt able to statically stand without UE support                            Cognition Arousal/Alertness: Awake/alert Behavior During Therapy: WFL for tasks assessed/performed Overall Cognitive Status: Within Functional Limits for tasks assessed                                        Exercises Total Joint Exercises Ankle Circles/Pumps: AROM;Both;20 reps Short Arc Quad: AROM;Right;15 reps Other Exercises Other Exercises: Pt able to recall 3/3 precautions.    General Comments        Pertinent Vitals/Pain Pain Assessment: No/denies pain    Home Living                      Prior Function            PT Goals (current goals can now be found in the care plan section) Progress towards PT goals: Progressing toward goals    Frequency    BID      PT Plan Current  plan remains appropriate    Co-evaluation              AM-PAC PT "6 Clicks" Mobility   Outcome Measure  Help needed turning from your back to your side while in a flat bed without using bedrails?: A Little Help needed moving from lying on your back to sitting on the side of a flat bed without using bedrails?: A Little Help needed moving to and from a bed to a chair (including a wheelchair)?: A Little Help needed standing up from a chair using your arms (e.g., wheelchair or bedside chair)?: A Little Help needed to walk in hospital room?: A Little Help needed climbing 3-5 steps with a railing? :  A Lot 6 Click Score: 17    End of Session Equipment Utilized During Treatment: Gait belt Activity Tolerance: Patient tolerated treatment well Patient left: with call bell/phone within reach;with chair alarm set;in chair;with family/visitor present Nurse Communication: Mobility status PT Visit Diagnosis: Unsteadiness on feet (R26.81);Other abnormalities of gait and mobility (R26.89);Muscle weakness (generalized) (M62.81)     Time: 5465-6812 PT Time Calculation (min) (ACUTE ONLY): 24 min  Charges:  $Gait Training: 23-37 mins                     Lieutenant Diego PT, DPT 1:54 PM,12/26/20

## 2020-12-26 NOTE — Progress Notes (Signed)
  Subjective: 2 Days Post-Op Procedure(s) (LRB): TOTAL HIP ARTHROPLASTY (Right) Patient reports pain as well-controlled.   Patient is well, and has had no acute complaints or problems Negative for chest pain and shortness of breath Fever: no Gastrointestinal: negative for nausea and vomiting.  Patient has had a bowel movement.  Objective: Vital signs in last 24 hours: Temp:  [97.7 F (36.5 C)-98.6 F (37 C)] 98.5 F (36.9 C) (03/25 0735) Pulse Rate:  [69-94] 94 (03/25 0735) Resp:  [16-19] 19 (03/25 0735) BP: (102-140)/(45-49) 125/46 (03/25 0735) SpO2:  [94 %-100 %] 94 % (03/25 0735)  Intake/Output from previous day:  Intake/Output Summary (Last 24 hours) at 12/26/2020 0845 Last data filed at 12/26/2020 0622 Gross per 24 hour  Intake 860 ml  Output 665 ml  Net 195 ml    Intake/Output this shift: No intake/output data recorded.  Labs: No results for input(s): HGB in the last 72 hours. No results for input(s): WBC, RBC, HCT, PLT in the last 72 hours. No results for input(s): NA, K, CL, CO2, BUN, CREATININE, GLUCOSE, CALCIUM in the last 72 hours. No results for input(s): LABPT, INR in the last 72 hours.   EXAM General - Patient is Alert, Appropriate and Oriented Extremity - Neurovascular intact Dorsiflexion/Plantar flexion intact Compartment soft Dressing/Incision -Hemovac in place.  Motor Function - intact, moving foot and toes well on exam. No drainage over dressing Cardiovascular- borderline tachycardic rate, normal rhythm Respiratory- crackles heard in left lower lobe Gastrointestinal- soft, nontender and active bowel sounds   Assessment/Plan: 2 Days Post-Op Procedure(s) (LRB): TOTAL HIP ARTHROPLASTY (Right) Active Problems:   H/O total hip arthroplasty  Estimated body mass index is 35.07 kg/m as calculated from the following:   Height as of this encounter: 5\' 3"  (1.6 m).   Weight as of this encounter: 89.8 kg. Advance diet Up with therapy  Ordered CXR  and CBC.   DVT Prophylaxis - Lovenox, Ted hose and foot pumps Weight-Bearing as tolerated to right leg  Cassell Smiles, PA-C [facility] 12/26/2020, 8:45 AM

## 2020-12-26 NOTE — Progress Notes (Signed)
Physical Therapy Treatment Patient Details Name: Margaret Cannon MRN: [id number] DOB: 03/03/1941 Today's Date: 12/26/2020    History of Present Illness Pt is a 81 y.o. female here for elective R THA posterior approach. PMH of: GERD, HTN, obesity, glaucoma, and osteopenia.    PT Comments    Patient alert, agreeable to PT, reported 0/10 pain in R hip. Session focused on progressing pt mobility as tolerated; BP assessed pt not orthostatic this AM, but did fatigue with activity. Bed mobility with supervision, good sitting balance noted. Able to transfer to recliner in room with RW and CGA, and have a seated rest break prior to further ambulation. She ambulated ~87ft in total with RW and CGA, close chair follow for safety. No LOB noted, reciprocal gait pattern. Pt returned to room and set up with breakfast, all needs in reach. Pt recalled 3/3 hip precautions, PT reinforced not reaching past knee in sitting and pt verbalized understanding. The patient would benefit from further skilled PT intervention to continue to progress towards goals. Recommendation remains appropriate. Pt needs to perform stair navigation this PM to ensure safe discharge home.       Follow Up Recommendations  Follow surgeon's recommendation for DC plan and follow-up therapies;Home health PT;Supervision for mobility/OOB     Equipment Recommendations  None recommended by PT    Recommendations for Other Services       Precautions / Restrictions Precautions Precautions: Posterior Hip Precaution Booklet Issued: Yes (comment) Precaution Comments: watch BP Restrictions Weight Bearing Restrictions: Yes RLE Weight Bearing: Weight bearing as tolerated    Mobility  Bed Mobility Overal bed mobility: Needs Assistance Bed Mobility: Supine to Sit     Supine to sit: Supervision     General bed mobility comments: use of bed rails, no physical assist needed    Transfers Overall transfer level: Modified  independent Equipment used: Rolling walker (2 wheeled) Transfers: Sit to/from Stand Sit to Stand: Min guard         General transfer comment: more effortful this AM for patient but able to perform without physical assist  Ambulation/Gait Ambulation/Gait assistance: Min guard Gait Distance (Feet): 75 Feet Assistive device: Rolling walker (2 wheeled)       General Gait Details: reciprocal gait pattern noted, no LOB, pt fatigued. close chair follow for safety   Stairs             Wheelchair Mobility    Modified Rankin (Stroke Patients Only)       Balance Overall balance assessment: Needs assistance Sitting-balance support: Bilateral upper extremity supported Sitting balance-Leahy Scale: Good     Standing balance support: Bilateral upper extremity supported Standing balance-Leahy Scale: Fair Standing balance comment: able to stand with unilateral UE support to allow for BP reading on other arm                            Cognition Arousal/Alertness: Awake/alert Behavior During Therapy: WFL for tasks assessed/performed Overall Cognitive Status: Within Functional Limits for tasks assessed                                        Exercises Total Joint Exercises Ankle Circles/Pumps: AROM;Both;20 reps Long Arc Quad: AROM;Strengthening;Both;20 reps Other Exercises Other Exercises: supine: 135/51 (89) sitting: 106/73 (109); seated 2 minutes: 132/53 (96), standing initial: 121/72 (103). standing at 3 min: 125/62 (96)  General Comments        Pertinent Vitals/Pain Pain Assessment: 0-10 Pain Score: 0-No pain    Home Living                      Prior Function            PT Goals (current goals can now be found in the care plan section) Progress towards PT goals: Progressing toward goals    Frequency    BID      PT Plan Current plan remains appropriate    Co-evaluation              AM-PAC PT "6 Clicks"  Mobility   Outcome Measure  Help needed turning from your back to your side while in a flat bed without using bedrails?: A Little Help needed moving from lying on your back to sitting on the side of a flat bed without using bedrails?: A Little Help needed moving to and from a bed to a chair (including a wheelchair)?: A Little Help needed standing up from a chair using your arms (e.g., wheelchair or bedside chair)?: A Little Help needed to walk in hospital room?: A Little Help needed climbing 3-5 steps with a railing? : A Lot 6 Click Score: 17    End of Session Equipment Utilized During Treatment: Gait belt Activity Tolerance: Patient tolerated treatment well Patient left: with call bell/phone within reach;with chair alarm set;in chair;with family/visitor present Nurse Communication: Mobility status PT Visit Diagnosis: Unsteadiness on feet (R26.81);Other abnormalities of gait and mobility (R26.89);Muscle weakness (generalized) (M62.81)     Time: 2122-4825 PT Time Calculation (min) (ACUTE ONLY): 23 min  Charges:  $Therapeutic Exercise: 23-37 mins                     Lieutenant Diego PT, DPT 9:02 AM,12/26/20

## 2020-12-27 DIAGNOSIS — G8929 Other chronic pain: Secondary | ICD-10-CM | POA: Diagnosis present

## 2020-12-27 DIAGNOSIS — M1611 Unilateral primary osteoarthritis, right hip: Secondary | ICD-10-CM | POA: Diagnosis present

## 2020-12-27 DIAGNOSIS — D62 Acute posthemorrhagic anemia: Secondary | ICD-10-CM | POA: Diagnosis not present

## 2020-12-27 DIAGNOSIS — Z6835 Body mass index (BMI) 35.0-35.9, adult: Secondary | ICD-10-CM | POA: Diagnosis not present

## 2020-12-27 DIAGNOSIS — Z20822 Contact with and (suspected) exposure to covid-19: Secondary | ICD-10-CM | POA: Diagnosis present

## 2020-12-27 DIAGNOSIS — J189 Pneumonia, unspecified organism: Secondary | ICD-10-CM | POA: Diagnosis not present

## 2020-12-27 DIAGNOSIS — Z7901 Long term (current) use of anticoagulants: Secondary | ICD-10-CM | POA: Diagnosis not present

## 2020-12-27 DIAGNOSIS — H40002 Preglaucoma, unspecified, left eye: Secondary | ICD-10-CM | POA: Diagnosis present

## 2020-12-27 DIAGNOSIS — E785 Hyperlipidemia, unspecified: Secondary | ICD-10-CM | POA: Diagnosis present

## 2020-12-27 DIAGNOSIS — M8580 Other specified disorders of bone density and structure, unspecified site: Secondary | ICD-10-CM | POA: Diagnosis present

## 2020-12-27 DIAGNOSIS — Z79899 Other long term (current) drug therapy: Secondary | ICD-10-CM | POA: Diagnosis not present

## 2020-12-27 DIAGNOSIS — K219 Gastro-esophageal reflux disease without esophagitis: Secondary | ICD-10-CM | POA: Diagnosis present

## 2020-12-27 DIAGNOSIS — Z87891 Personal history of nicotine dependence: Secondary | ICD-10-CM | POA: Diagnosis not present

## 2020-12-27 DIAGNOSIS — I4891 Unspecified atrial fibrillation: Secondary | ICD-10-CM | POA: Diagnosis present

## 2020-12-27 DIAGNOSIS — I10 Essential (primary) hypertension: Secondary | ICD-10-CM | POA: Diagnosis present

## 2020-12-27 DIAGNOSIS — L209 Atopic dermatitis, unspecified: Secondary | ICD-10-CM | POA: Diagnosis present

## 2020-12-27 LAB — BASIC METABOLIC PANEL
Anion gap: 7 (ref 5–15)
BUN: 12 mg/dL (ref 8–23)
CO2: 24 mmol/L (ref 22–32)
Calcium: 7.8 mg/dL — ABNORMAL LOW (ref 8.9–10.3)
Chloride: 103 mmol/L (ref 98–111)
Creatinine, Ser: 0.92 mg/dL (ref 0.44–1.00)
GFR, Estimated: >60 mL/min (ref >60)
Glucose, Bld: 78 mg/dL (ref 70–99)
Potassium: 3.8 mmol/L (ref 3.5–5.1)
Sodium: 134 mmol/L — ABNORMAL LOW (ref 135–145)

## 2020-12-27 LAB — CBC WITH DIFFERENTIAL/PLATELET
Abs Immature Granulocytes: 0.08 10*3/uL — ABNORMAL HIGH (ref 0.00–0.07)
Basophils Absolute: 0 10*3/uL (ref 0.0–0.1)
Basophils Relative: 0 %
Eosinophils Absolute: 0.1 10*3/uL (ref 0.0–0.5)
Eosinophils Relative: 1 %
HCT: 24.1 % — ABNORMAL LOW (ref 36.0–46.0)
Hemoglobin: 8.2 g/dL — ABNORMAL LOW (ref 12.0–15.0)
Immature Granulocytes: 1 %
Lymphocytes Relative: 14 %
Lymphs Abs: 1.6 10*3/uL (ref 0.7–4.0)
MCH: 28.8 pg (ref 26.0–34.0)
MCHC: 34 g/dL (ref 30.0–36.0)
MCV: 84.6 fL (ref 80.0–100.0)
Monocytes Absolute: 0.7 10*3/uL (ref 0.1–1.0)
Monocytes Relative: 6 %
Neutro Abs: 8.9 10*3/uL — ABNORMAL HIGH (ref 1.7–7.7)
Neutrophils Relative %: 78 %
Platelets: 176 10*3/uL (ref 150–400)
RBC: 2.85 MIL/uL — ABNORMAL LOW (ref 3.87–5.11)
RDW: 14 % (ref 11.5–15.5)
WBC: 11.3 10*3/uL — ABNORMAL HIGH (ref 4.0–10.5)
nRBC: 0 % (ref 0.0–0.2)

## 2020-12-27 LAB — SARS CORONAVIRUS 2 (TAT 6-24 HRS): SARS Coronavirus 2: NEGATIVE

## 2020-12-27 NOTE — Discharge Summary (Signed)
Physician Discharge Summary  Patient ID: Margaret Cannon MRN: [id number] DOB/AGE: 11-03-1940 80 y.o.  Admit date: 12/24/2020 Discharge date: 12/28/2020  Admission Diagnoses:  H/O total hip arthroplasty [Z96.649]  Surgeries:Procedure(s): Right total hip arthroplasty  SURGEON:  Marciano Sequin. M.D.  ASSISTANT: Cassell Smiles, PA-C (present and scrubbed throughout the case, critical for assistance with exposure, retraction, instrumentation, and closure)  ANESTHESIA: spinal  ESTIMATED BLOOD LOSS: 150 mL  FLUIDS REPLACED: 2000 mL of crystalloid  DRAINS: 2 medium Hemovac drains  IMPLANTS UTILIZED: DePuy 12 mm small stature AML femoral stem, 50 mm OD Pinnacle 100 acetabular component, +4 mm degree Pinnacle Marathon polyethylene insert, and a 32 mm CoCr +1 mm hip ball  Discharge Diagnoses: Patient Active Problem List   Diagnosis Date Noted  . Pneumonia 12/26/2020  . H/O total hip arthroplasty 12/24/2020  . Primary osteoarthritis of right hip 09/25/2020  . Obesity 09/03/2020  . Atopic dermatitis 04/16/2020  . Routine general medical examination at health care facility 09/16/2019  . 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 Other specified disorders of veins 06/29/2016  . Osteopenia 08/13/2014  . Tinea versicolor 01/13/2014  . 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 Abdominal pain, other specified site 07/13/2012  . Anemia 07/12/2012  . Chronic leg pain 07/12/2012  . GERD (gastroesophageal reflux disease) 07/12/2012  . HTN (hypertension) 07/12/2012    Past Medical History:  Diagnosis Date  . GERD (gastroesophageal reflux disease)   . Glaucoma    left eye  . Heart murmur   . Hypertension   . Osteopenia        Consultants (if any): Treatment Team:  [facility] Team 3, MD Sidney Ace, MDHospitalist service for pneumonia  Discharged Condition: Improved  Hospital Course: Margaret Cannon is an 80 y.o. female who was admitted 12/24/2020  with a diagnosis of right hip osteoarthritis and went to the operating room on 12/24/2020 and underwent right total hip arthroplasty through posterior approach. The patient received perioperative antibiotics for prophylaxis (see below). The patient tolerated the procedure well and was transported to PACU in stable condition. After meeting PACU criteria, the patient was subsequently transferred to the Orthopaedics/Rehabilitation unit.   The patient received DVT prophylaxis in the form of early mobilization, Lovenox, Foot Pumps and TED hose. A sacral pad had been placed and heels were elevated off of the bed with rolled towels in order to protect skin integrity. Foley catheter was discontinued on postoperative day #0. Wound drains were discontinued on postoperative day #2. The surgical incision was healing well without signs of infection.  Physical therapy was initiated postoperatively for transfers, gait training, and strengthening. Occupational therapy was initiated for activities of daily living and evaluation for assisted devices. Rehabilitation goals were reviewed in detail with the patient. The patient made steady progress with physical therapy and physical therapy recommended discharge to Home.   Patient's post-operative course was complicated by development of pneumonia, for which the hospitalist service was consulted. Patient was placed on IV antibiotics initially and transitioned to oral antibiotics at discharge.  On postop day 4 the white count level was at 8.7 and hemoglobin 11 was 8.3.  She was much improved with congestion.  She was ambulating 200 feet with physical therapy.  The patient achieved the preliminary goals of this hospitalization and was felt to be medically and orthopaedically appropriate for discharge.  She was given perioperative antibiotics:  Anti-infectives (From admission, onward)   Start     Dose/Rate Route Frequency Ordered Stop  12/26/20 1830  azithromycin (ZITHROMAX)  500 mg in sodium chloride 0.9 % 250 mL IVPB        500 mg 250 mL/hr over 60 Minutes Intravenous Every 24 hours 12/26/20 1739 12/29/20 1829   12/26/20 1830  cefTRIAXone (ROCEPHIN) 1 g in sodium chloride 0.9 % 100 mL IVPB        1 g 200 mL/hr over 30 Minutes Intravenous Every 24 hours 12/26/20 1739 12/29/20 1829   12/24/20 1500  ceFAZolin (ANCEF) IVPB 2g/100 mL premix        2 g 200 mL/hr over 30 Minutes Intravenous Every 6 hours 12/24/20 1415 12/24/20 2322   12/24/20 0622  ceFAZolin (ANCEF) 2-4 GM/100ML-% IVPB       Note to Pharmacy: Trudie Reed   : cabinet override      12/24/20 0622 12/24/20 0801   12/24/20 0615  ceFAZolin (ANCEF) IVPB 2g/100 mL premix        2 g 200 mL/hr over 30 Minutes Intravenous On call to O.R. 12/24/20 1610 12/24/20 0815    .  Recent vital signs:  Vitals:   12/27/20 2344 12/28/20 0512  BP: (!) 131/51 (!) 143/51  Pulse: 87 89  Resp: 16 16  Temp: 98 F (36.7 C) 98.2 F (36.8 C)  SpO2: 100% 97%    Recent laboratory studies:  Recent Labs    12/26/20 0935 12/27/20 0433 12/28/20 0525  WBC 13.2* 11.3* 8.7  HGB 8.9* 8.2* 8.3*  HCT 26.7* 24.1* 25.0*  PLT 178 176 195  K  --  3.8  --   CL  --  103  --   CO2  --  24  --   BUN  --  12  --   CREATININE  --  0.92  --   GLUCOSE  --  78  --   CALCIUM  --  7.8*  --     Diagnostic Studies: DG Chest 2 View  Result Date: 12/26/2020 CLINICAL DATA:  Lung crackles EXAM: CHEST - 2 VIEW COMPARISON:  03/06/2006 chest radiograph. FINDINGS: Stable cardiomediastinal silhouette with mild cardiomegaly and moderate hiatal hernia. No pneumothorax. Trace bilateral pleural effusions. Extensive patchy consolidation throughout the left lung. Clear right lung. IMPRESSION: Extensive patchy consolidation throughout the left lung, most compatible with multilobar pneumonia. Recommend follow-up chest radiographs to resolution. Mild cardiomegaly.  Trace bilateral pleural effusions. Moderate hiatal hernia. Electronically Signed   By:  Ilona Sorrel M.D.   On: 12/26/2020 09:45   DG Hip Port Unilat With Pelvis 1V Right  Result Date: 12/24/2020 CLINICAL DATA:  Postop total hip replacement. EXAM: DG HIP (WITH OR WITHOUT PELVIS) 1V PORT RIGHT COMPARISON:  No prior. FINDINGS: Total right hip replacement. Hardware intact. Anatomic alignment. Degenerative changes left hip. Surgical drainage noted the right hip. Peripheral vascular calcification. IMPRESSION: 1. Total right hip replacement. Hardware intact. Anatomic alignment. 2. Peripheral vascular disease. Electronically Signed   By: Marcello Moores  Register   On: 12/24/2020 13:33    Discharge Medications:   Allergies as of 12/28/2020   No Known Allergies     Medication List    TAKE these medications   acetaminophen 650 MG CR tablet Commonly known as: TYLENOL Take 650 mg by mouth every 8 (eight) hours as needed for pain.   CALCIUM 600 + D PO Take 1 tablet by mouth daily.   celecoxib 200 MG capsule Commonly known as: CELEBREX Take 1 capsule (200 mg total) by mouth 2 (two) times daily.   enoxaparin 40 MG/0.4ML injection  Commonly known as: LOVENOX Inject 0.4 mLs (40 mg total) into the skin daily for 14 days.   lovastatin 10 MG tablet Commonly known as: MEVACOR Take 10 mg by mouth daily.   MUSCLE RUB EX Apply 1 application topically daily as needed (pain).   omeprazole 20 MG tablet Commonly known as: PRILOSEC OTC Take 20 mg by mouth daily.   SYSTANE OP Place 1 drop into both eyes daily as needed (dry eyes).   THERAWORX RELIEF EX Apply 1 application topically daily as needed (pain).   traMADol 50 MG tablet Commonly known as: ULTRAM Take 1 tablet (50 mg total) by mouth every 4 (four) hours as needed for moderate pain.   triamcinolone 0.1 % Commonly known as: KENALOG Apply 1 application topically 2 (two) times daily as needed (dry skin).   triamterene-hydrochlorothiazide 37.5-25 MG capsule Commonly known as: DYAZIDE Take 1 capsule by mouth every morning.             Durable Medical Equipment  (From admission, onward)         Start     Ordered   12/24/20 1416  DME Walker rolling  Once       Question:  Patient needs a walker to treat with the following condition  Answer:  S/P total hip arthroplasty   12/24/20 1415   12/24/20 1416  DME Bedside commode  Once       Question:  Patient needs a bedside commode to treat with the following condition  Answer:  S/P total hip arthroplasty   12/24/20 1415          Disposition: home with home health PT      Follow-up Information    Dereck Leep, MD On 02/05/2021.   Specialty: Orthopedic Surgery Why: at 1:30pm Contact information: [place] [id number] [place], PA-C 12/28/2020, 6:54 AM

## 2020-12-27 NOTE — Progress Notes (Signed)
Physical Therapy Treatment Patient Details Name: Margaret Cannon MRN: [id number] DOB: 06/17/1941 Today's Date: 12/27/2020    History of Present Illness Pt is a 80 y.o. female here for elective R THA posterior approach. PMH of: GERD, HTN, obesity, glaucoma, and osteopenia.    PT Comments    Pt was long sitting in bed upon arriving. She is A and O x 4 and agreeable to OOB/PT session. Pt was able to safely exit bed, stand, and ambulate 200 ft with use of RW. NO LOB or unsteadiness noted. Both supportive spouse and patient feel confident she can safely DC home tomorrow. She was able to correctly state hip precautions and adhere to throughout session. Recommend continued skilled PT at DC to improve safety while assisting pt to PLOF.    Follow Up Recommendations  Home health PT;Supervision for mobility/OOB (would be able to DC to OP PT if surgeon prefers)     Equipment Recommendations  None recommended by PT    Recommendations for Other Services       Precautions / Restrictions Precautions Precautions: Posterior Hip Precaution Booklet Issued: Yes (comment) Restrictions Weight Bearing Restrictions: Yes RLE Weight Bearing: Weight bearing as tolerated    Mobility  Bed Mobility Overal bed mobility: Modified Independent Bed Mobility: Supine to Sit     Supine to sit: Supervision     General bed mobility comments: no Physical assistance to exit bed    Transfers Overall transfer level: Modified independent Equipment used: Rolling walker (2 wheeled)   Sit to Stand: Modified independent (Device/Increase time)         General transfer comment: no physical assistance or Vcs for technique. pt easily able to stand/sit without assist  Ambulation/Gait Ambulation/Gait assistance: Supervision Gait Distance (Feet): 200 Feet Assistive device: Rolling walker (2 wheeled) Gait Pattern/deviations: WFL(Within Functional Limits) Gait velocity: decreased   General Gait Details:  pt demonstrates safe ambulation with use of RW. does have flexed posture. able to slightly improve with vsc   General stair comments: reviewed proper sequencing. pt states understanding and feels confident in her abilities from previous PT session      Balance Overall balance assessment: Needs assistance Sitting-balance support: Bilateral upper extremity supported Sitting balance-Leahy Scale: Good     Standing balance support: Bilateral upper extremity supported Standing balance-Leahy Scale: Fair Standing balance comment: no LOB throughout session with UE support on RW             Cognition Arousal/Alertness: Awake/alert Behavior During Therapy: WFL for tasks assessed/performed Overall Cognitive Status: Within Functional Limits for tasks assessed        General Comments: Pt is A and O x 4             Pertinent Vitals/Pain Pain Assessment: No/denies pain Pain Score: 0-No pain Faces Pain Scale: No hurt Pain Location: R hip; posterior Pain Descriptors / Indicators: Aching;Guarding;Grimacing Pain Intervention(s): Limited activity within patient's tolerance;Monitored during session;Premedicated before session;Repositioned           PT Goals (current goals can now be found in the care plan section) Acute Rehab PT Goals Patient Stated Goal: Go home Progress towards PT goals: Progressing toward goals    Frequency    BID      PT Plan Current plan remains appropriate       AM-PAC PT "6 Clicks" Mobility   Outcome Measure  Help needed turning from your back to your side while in a flat bed without using bedrails?: A Little Help needed moving  from lying on your back to sitting on the side of a flat bed without using bedrails?: A Little Help needed moving to and from a bed to a chair (including a wheelchair)?: A Little Help needed standing up from a chair using your arms (e.g., wheelchair or bedside chair)?: A Little Help needed to walk in hospital room?: A  Little Help needed climbing 3-5 steps with a railing? : A Little 6 Click Score: 18    End of Session Equipment Utilized During Treatment: Gait belt Activity Tolerance: Patient tolerated treatment well Patient left: in chair;with call bell/phone within reach;with chair alarm set;with family/visitor present Nurse Communication: Mobility status PT Visit Diagnosis: Unsteadiness on feet (R26.81);Other abnormalities of gait and mobility (R26.89);Muscle weakness (generalized) (M62.81)     Time: 0626-9485 PT Time Calculation (min) (ACUTE ONLY): 12 min  Charges:  $Gait Training: 8-22 mins                     Julaine Fusi PTA 12/27/20, 11:24 AM

## 2020-12-27 NOTE — Progress Notes (Signed)
I have reviewed the charting completed by the student nurse and I agree with her assessments.  Earleen Reaper, RN

## 2020-12-27 NOTE — Progress Notes (Signed)
PROGRESS NOTE    Margaret Cannon  UTM:[id number] DOB: 1941-03-06 DOA: 12/24/2020 PCP: Sharyne Peach, MD  Brief Narrative:  80 y.o. female with medical history significant for hypertension, GERD, truncal obesity, chronic leg pain, history of atopic dermatitis, varicose veins, hyperlipidemia, atrial fibrillation on Eliquis 5 mg twice daily, admitted to orthopedic service for total right hip arthroplasty.  She is status post total right hip arthroplasty on 12/24/2020.  Ms. Subia has been doing well and participating with physical therapy.  Per patient, patient uses stairs with physical therapy today.  On orthopedic service rounds, patient endorsed cough that is productive.  She denies fever.  Orthopedic team ordered portable chest x-ray which showed multilobar pneumonia.  Medicine was consulted for pneumonia.  Patient was started on intravenous Rocephin and azithromycin for community-acquired pneumonia.  Reports improvement in her symptoms since initiation of antibiotics.  White count downtrending.  No fevers over interval.  No oxygen requirement.   Assessment & Plan:   Principal Problem:   Pneumonia Active Problems:   Atopic dermatitis   Chronic leg pain   GERD (gastroesophageal reflux disease)   HTN (hypertension)   Obesity   H/O total hip arthroplasty  Community-acquired pneumonia Noted on imaging after patient endorsed productive cough Started on azithromycin and Rocephin with improvement in symptoms Cough also improved COVID-19 negative  Recommendations:  Continue Rocephin azithromycin Primary team indicates plan for discharge tomorrow 3/27.  We will see patient in a.m. and make recommendations regarding outpatient antibiotic therapy Repeat CBC in a.m. to ensure downtrend of WBC If patient has breakthrough fevers, worsening cough, worsening leukocytosis will recommend pursuit of cross-sectional imaging of chest   Status post total right hip  arthroplasty pain management per primary orthopedic team -Dilaudid, oxycodone, tramadol, acetaminophen  Hyperlipidemia pravastatin 10 mg daily per primary team  Hypertension Per primary team as resumed triamterene-hydrochlorothiazide 1 tablet daily Reasonable control on above regimen Ensure pain control Consider addition of as needed IV hydralazine if blood pressure remains an issue  History of atrial fibrillation on home Eliquis 5 mg twice daily, this is being held due to recent hip arthroplasty.  Restart when okay with primary team  PT/OT per primary team    DVT prophylaxis: Per primary team Code Status: Full Family Communication: None today Disposition Plan: Per primary team.  Hospitalist on consult      Level of care: Med-Surg  Consultants:   Hospitalist  Procedures:   Total right hip arthroplasty  Antimicrobials:   Rocephin  Azithromycin   Subjective: Patient seen and examined.  Reports improvement in respiratory status since initiation of antibiotics  Objective: Vitals:   12/26/20 1937 12/26/20 2347 12/27/20 0414 12/27/20 0745  BP: (!) 120/55 (!) 149/50 120/66 (!) 142/60  Pulse: (!) 102 93 98 (!) 105  Resp: 17 16 16 14   Temp: 97.9 F (36.6 C) (!) 97.5 F (36.4 C) 98.7 F (37.1 C) 98.3 F (36.8 C)  TempSrc:    Oral  SpO2: 98% 99% 99% 95%  Weight:      Height:        Intake/Output Summary (Last 24 hours) at 12/27/2020 1021 Last data filed at 12/27/2020 5681 Gross per 24 hour  Intake 355 ml  Output 950 ml  Net -595 ml   Filed Weights   12/24/20 0628  Weight: 89.8 kg    Examination:  General exam: Appears calm and comfortable  Respiratory system: Normal work of breathing.  Scattered crackles on left.  Room air Cardiovascular system: S1 &  S2 heard, RRR. No JVD, murmurs, rubs, gallops or clicks. No pedal edema. Gastrointestinal system: Abdomen is nondistended, soft and nontender. No organomegaly or masses felt. Normal bowel sounds  heard. Central nervous system: Alert and oriented. No focal neurological deficits. Extremities: Symmetric 5 x 5 power. Skin: No rashes, lesions or ulcers Psychiatry: Judgement and insight appear normal. Mood & affect appropriate.     Data Reviewed: I have personally reviewed following labs and imaging studies  CBC: Recent Labs  Lab 12/26/20 0935 12/27/20 0433  WBC 13.2* 11.3*  NEUTROABS  --  8.9*  HGB 8.9* 8.2*  HCT 26.7* 24.1*  MCV 84.2 84.6  PLT 178 923   Basic Metabolic Panel: Recent Labs  Lab 12/27/20 0433  NA 134*  K 3.8  CL 103  CO2 24  GLUCOSE 78  BUN 12  CREATININE 0.92  CALCIUM 7.8*   GFR: Estimated Creatinine Clearance: 54.5 mL/min (by C-G formula based on SCr of 0.92 mg/dL). Liver Function Tests: No results for input(s): AST, ALT, ALKPHOS, BILITOT, PROT, ALBUMIN in the last 168 hours. No results for input(s): LIPASE, AMYLASE in the last 168 hours. No results for input(s): AMMONIA in the last 168 hours. Coagulation Profile: No results for input(s): INR, PROTIME in the last 168 hours. Cardiac Enzymes: No results for input(s): CKTOTAL, CKMB, CKMBINDEX, TROPONINI in the last 168 hours. BNP (last 3 results) No results for input(s): PROBNP in the last 8760 hours. HbA1C: No results for input(s): HGBA1C in the last 72 hours. CBG: No results for input(s): GLUCAP in the last 168 hours. Lipid Profile: No results for input(s): CHOL, HDL, LDLCALC, TRIG, CHOLHDL, LDLDIRECT in the last 72 hours. Thyroid Function Tests: No results for input(s): TSH, T4TOTAL, FREET4, T3FREE, THYROIDAB in the last 72 hours. Anemia Panel: No results for input(s): VITAMINB12, FOLATE, FERRITIN, TIBC, IRON, RETICCTPCT in the last 72 hours. Sepsis Labs: No results for input(s): PROCALCITON, LATICACIDVEN in the last 168 hours.  Recent Results (from the past 240 hour(s))  SARS CORONAVIRUS 2 (TAT 6-24 HRS) Nasopharyngeal Nasopharyngeal Swab     Status: None   Collection Time: 12/22/20   1:39 PM   Specimen: Nasopharyngeal Swab  Result Value Ref Range Status   SARS Coronavirus 2 NEGATIVE NEGATIVE Final    Comment: (NOTE) SARS-CoV-2 target nucleic acids are NOT DETECTED.  The SARS-CoV-2 RNA is generally detectable in upper and lower respiratory specimens during the acute phase of infection. Negative results do not preclude SARS-CoV-2 infection, do not rule out co-infections with other pathogens, and should not be used as the sole basis for treatment or other patient management decisions. Negative results must be combined with clinical observations, patient history, and epidemiological information. The expected result is Negative.  Fact Sheet for Patients: SugarRoll.be  Fact Sheet for Healthcare Providers: https://www.woods-mathews.com/  This test is not yet approved or cleared by the [place] FDA and  has been authorized for detection and/or diagnosis of SARS-CoV-2 by FDA under an Emergency Use Authorization (EUA). This EUA will remain  in effect (meaning this test can be used) for the duration of the COVID-19 declaration under Se ction 564(b)(1) of the Act, 21 U.S.C. section 360bbb-3(b)(1), unless the authorization is terminated or revoked sooner.  Performed at [facility], [place] [address] [place], [place] [address]   SARS CORONAVIRUS 2 (TAT 6-24 HRS) Nasopharyngeal Nasopharyngeal Swab     Status: None   Collection Time: 12/26/20  6:45 PM   Specimen: Nasopharyngeal Swab  Result Value Ref Range Status  SARS Coronavirus 2 NEGATIVE NEGATIVE Final    Comment: (NOTE) SARS-CoV-2 target nucleic acids are NOT DETECTED.  The SARS-CoV-2 RNA is generally detectable in upper and lower respiratory specimens during the acute phase of infection. Negative results do not preclude SARS-CoV-2 infection, do not rule out co-infections with other pathogens, and should not be used as the sole basis for treatment or other  patient management decisions. Negative results must be combined with clinical observations, patient history, and epidemiological information. The expected result is Negative.  Fact Sheet for Patients: SugarRoll.be  Fact Sheet for Healthcare Providers: https://www.woods-mathews.com/  This test is not yet approved or cleared by the [place] FDA and  has been authorized for detection and/or diagnosis of SARS-CoV-2 by FDA under an Emergency Use Authorization (EUA). This EUA will remain  in effect (meaning this test can be used) for the duration of the COVID-19 declaration under Se ction 564(b)(1) of the Act, 21 U.S.C. section 360bbb-3(b)(1), unless the authorization is terminated or revoked sooner.  Performed at [facility], [place] [address], [place], [place] [address]          Radiology Studies: DG Chest 2 View  Result Date: 12/26/2020 CLINICAL DATA:  Lung crackles EXAM: CHEST - 2 VIEW COMPARISON:  03/06/2006 chest radiograph. FINDINGS: Stable cardiomediastinal silhouette with mild cardiomegaly and moderate hiatal hernia. No pneumothorax. Trace bilateral pleural effusions. Extensive patchy consolidation throughout the left lung. Clear right lung. IMPRESSION: Extensive patchy consolidation throughout the left lung, most compatible with multilobar pneumonia. Recommend follow-up chest radiographs to resolution. Mild cardiomegaly.  Trace bilateral pleural effusions. Moderate hiatal hernia. Electronically Signed   By: Ilona Sorrel M.D.   On: 12/26/2020 09:45        Scheduled Meds:  calcium-vitamin D  1 tablet Oral Daily   celecoxib  200 mg Oral BID   enoxaparin (LOVENOX) injection  30 mg Subcutaneous Q12H   ferrous sulfate  325 mg Oral BID WC   magnesium hydroxide  30 mL Oral Daily   pantoprazole  40 mg Oral BID   pravastatin  10 mg Oral q1800   senna-docusate  1 tablet Oral Daily   triamterene-hydrochlorothiazide  1  tablet Oral BH-q7a   Continuous Infusions:  sodium chloride 100 mL/hr at 12/27/20 0303   azithromycin Stopped (12/26/20 2102)   cefTRIAXone (ROCEPHIN)  IV Stopped (12/26/20 1930)     LOS: 0 days    Time spent: 25 minutes    Sidney Ace, MD [facility] Pager [phone]  If 7PM-7AM, please contact night-coverage 12/27/2020, 10:21 AM

## 2020-12-27 NOTE — Progress Notes (Signed)
Physical Therapy Treatment Patient Details Name: Margaret Cannon MRN: [id number] DOB: [birth year] Today's Date: 12/27/2020    History of Present Illness Pt is a 80 y.o. female here for elective R THA posterior approach. PMH of: GERD, HTN, obesity, glaucoma, and osteopenia.    PT Comments    Pt was sitting in recliner upon arriving. Very pleasant and agreeable to PM PT session. Was able to stand and ambulate with RW without LOB or difficulty. She ambulated to rehab gym, performed stair training (1 step home entry), and tolerated there ex HEP without c/o pain. Pt will benefit from continues skilled PT at DC to assist pt with returning to PLOF. Pt reports having all equipment needs met for safe DC to home. Was able to adhere to posterior hip precautions throughout. Acute PT will continue to follow per POC.      Follow Up Recommendations  Home health PT     Equipment Recommendations  None recommended by PT       Precautions / Restrictions Precautions Precautions: Posterior Hip Precaution Booklet Issued: Yes (comment) Restrictions Weight Bearing Restrictions: Yes RLE Weight Bearing: Weight bearing as tolerated    Mobility  Bed Mobility Overal bed mobility: Modified Independent Bed Mobility: Supine to Sit     Supine to sit: Supervision     General bed mobility comments: pt was in recliner pre/post session    Transfers Overall transfer level: Modified independent Equipment used: Rolling walker (2 wheeled) Transfers: Sit to/from Stand Sit to Stand: Modified independent (Device/Increase time)         General transfer comment: no physical assistance or Vcs for technique. pt easily able to stand/sit without assist  Ambulation/Gait Ambulation/Gait assistance: Supervision Gait Distance (Feet): 200 Feet Assistive device: Rolling walker (2 wheeled) Gait Pattern/deviations: WFL(Within Functional Limits) Gait velocity: decreased   General Gait Details: flexed posture  during gait however steady without LOB   Stairs Stairs: Yes Stairs assistance: Min guard Stair Management: Forwards;Step to pattern;With walker Number of Stairs: 1 General stair comments: Pt was able to safely ascend/descend 1 step with CGA only. Demonstrated safe and proper performance with use or RW and step to technique.     Balance Overall balance assessment: Needs assistance Sitting-balance support: Bilateral upper extremity supported Sitting balance-Leahy Scale: Good     Standing balance support: Bilateral upper extremity supported Standing balance-Leahy Scale: Good Standing balance comment: no LOB with uE support. Was able to perform static activity without UE support without LOB       Cognition Arousal/Alertness: Awake/alert Behavior During Therapy: WFL for tasks assessed/performed Overall Cognitive Status: Within Functional Limits for tasks assessed      General Comments: Pt is A and O x 4      Exercises Total Joint Exercises Ankle Circles/Pumps: AROM;Both;20 reps Quad Sets: AROM;Both;10 reps Gluteal Sets: AROM;Strengthening;Both;10 reps Short Arc Quad: AROM;Right;15 reps Heel Slides: AROM;Strengthening;Both;10 reps Hip ABduction/ADduction: AROM;Strengthening;Right;10 reps        Pertinent Vitals/Pain Pain Assessment: No/denies pain Pain Score: 0-No pain Faces Pain Scale: No hurt Pain Location: R hip; posterior Pain Descriptors / Indicators: Aching;Guarding;Grimacing Pain Intervention(s): Limited activity within patient's tolerance;Monitored during session;Premedicated before session;Repositioned           PT Goals (current goals can now be found in the care plan section) Acute Rehab PT Goals Patient Stated Goal: Go home Progress towards PT goals: Progressing toward goals    Frequency    BID      PT Plan Current plan remains appropriate  AM-PAC PT "6 Clicks" Mobility   Outcome Measure  Help needed turning from your back to your  side while in a flat bed without using bedrails?: None Help needed moving from lying on your back to sitting on the side of a flat bed without using bedrails?: None Help needed moving to and from a bed to a chair (including a wheelchair)?: None Help needed standing up from a chair using your arms (e.g., wheelchair or bedside chair)?: None Help needed to walk in hospital room?: A Little Help needed climbing 3-5 steps with a railing? : A Little 6 Click Score: 22    End of Session Equipment Utilized During Treatment:  (none) Activity Tolerance: Patient tolerated treatment well Patient left: in chair;with call bell/phone within reach;with chair alarm set;with family/visitor present Nurse Communication: Mobility status PT Visit Diagnosis: Unsteadiness on feet (R26.81);Other abnormalities of gait and mobility (R26.89);Muscle weakness (generalized) (M62.81)     Time: 6168-3729 PT Time Calculation (min) (ACUTE ONLY): 30 min  Charges:  $Gait Training: 8-22 mins $Therapeutic Exercise: 8-22 mins                     Julaine Fusi PTA 12/27/20, 2:28 PM

## 2020-12-27 NOTE — Progress Notes (Addendum)
  Subjective: 3 Days Post-Op Procedure(s) (LRB): TOTAL HIP ARTHROPLASTY (Right) Patient reports pain as well-controlled.   Patient is well, and has had no acute complaints or problems Diagnosed with pneumonia yesterday.  Feeling better today breathing better. Fever: no Gastrointestinal: negative for nausea and vomiting.  Patient has had a bowel movement.  Objective: Vital signs in last 24 hours: Temp:  [97.5 F (36.4 C)-98.7 F (37.1 C)] 98.3 F (36.8 C) (03/26 0745) Pulse Rate:  [80-105] 105 (03/26 0745) Resp:  [14-19] 14 (03/26 0745) BP: (120-149)/(48-71) 142/60 (03/26 0745) SpO2:  [95 %-99 %] 95 % (03/26 0745)  Intake/Output from previous day:  Intake/Output Summary (Last 24 hours) at 12/27/2020 0755 Last data filed at 12/27/2020 1610 Gross per 24 hour  Intake 355 ml  Output 950 ml  Net -595 ml    Intake/Output this shift: No intake/output data recorded.  Labs: Recent Labs    12/26/20 0935 12/27/20 0433  HGB 8.9* 8.2*   Recent Labs    12/26/20 0935 12/27/20 0433  WBC 13.2* 11.3*  RBC 3.17* 2.85*  HCT 26.7* 24.1*  PLT 178 176   Recent Labs    12/27/20 0433  NA 134*  K 3.8  CL 103  CO2 24  BUN 12  CREATININE 0.92  GLUCOSE 78  CALCIUM 7.8*   No results for input(s): LABPT, INR in the last 72 hours.   EXAM General - Patient is Alert, Appropriate and Oriented Extremity - Neurovascular intact Dorsiflexion/Plantar flexion intact Compartment soft Dressing/Incision -dressing clean and dry.  No Hemovac. Motor Function - intact, moving foot and toes well on exam. No drainage over dressing Cardiovascular- borderline tachycardic rate, normal rhythm Respiratory-improved with no crackles. Gastrointestinal- soft, nontender and active bowel sounds   Assessment/Plan: 3 Days Post-Op Procedure(s) (LRB): TOTAL HIP ARTHROPLASTY (Right) Principal Problem:   Pneumonia Active Problems:   Atopic dermatitis   Chronic leg pain   GERD (gastroesophageal reflux  disease)   HTN (hypertension)   Obesity   H/O total hip arthroplasty  Estimated body mass index is 35.07 kg/m as calculated from the following:   Height as of this encounter: 5\' 3"  (1.6 m).   Weight as of this encounter: 89.8 kg. Advance diet Up with therapy  Acute blood loss anemia.  Hemoglobin 8.2.  Continue to follow.  Improved white count to 11.3 this morning.  Repeat CBC tomorrow.  IV antibiotics will be changed to p.o. azithromycin at discharge.  Plan for discharge tomorrow.  DVT Prophylaxis - Lovenox, Ted hose and foot pumps Weight-Bearing as tolerated to right leg  Reche Dixon, PA-C [facility] 12/27/2020, 7:55 AM

## 2020-12-28 LAB — CBC
HCT: 25 % — ABNORMAL LOW (ref 36.0–46.0)
Hemoglobin: 8.3 g/dL — ABNORMAL LOW (ref 12.0–15.0)
MCH: 28.4 pg (ref 26.0–34.0)
MCHC: 33.2 g/dL (ref 30.0–36.0)
MCV: 85.6 fL (ref 80.0–100.0)
Platelets: 195 10*3/uL (ref 150–400)
RBC: 2.92 MIL/uL — ABNORMAL LOW (ref 3.87–5.11)
RDW: 14.6 % (ref 11.5–15.5)
WBC: 8.7 10*3/uL (ref 4.0–10.5)
nRBC: 0 % (ref 0.0–0.2)

## 2020-12-28 MED ORDER — BENZONATATE 100 MG PO CAPS: 100.0000 mg | ORAL_CAPSULE | Freq: Three times a day (TID) | ORAL | 0 refills | Status: AC | PRN

## 2020-12-28 MED ORDER — AMOXICILLIN-POT CLAVULANATE 875-125 MG PO TABS: 1.0000 | ORAL_TABLET | Freq: Two times a day (BID) | ORAL | 0 refills | Status: AC

## 2020-12-28 MED ORDER — CELECOXIB 200 MG PO CAPS
200.0000 mg | ORAL_CAPSULE | Freq: Two times a day (BID) | ORAL | 0 refills | Status: DC
Start: 2020-12-28 — End: 2023-02-21

## 2020-12-28 MED ORDER — ENOXAPARIN SODIUM 40 MG/0.4ML ~~LOC~~ SOLN: 40.0000 mg | SUBCUTANEOUS | 0 refills | Status: DC

## 2020-12-28 MED ORDER — TRAMADOL HCL 50 MG PO TABS: 50.0000 mg | ORAL_TABLET | ORAL | 0 refills | Status: DC | PRN

## 2020-12-28 MED ORDER — AZITHROMYCIN 500 MG PO TABS: 500.0000 mg | ORAL_TABLET | Freq: Once | ORAL | Status: DC

## 2020-12-28 NOTE — Progress Notes (Signed)
PT IV removed, tramadol script given. All d/c education provided and all questions answered. Pt honey comb dressing c,d,i with some small older drainage. Pt has all belongings and transported to family car by [facility] in [facility]

## 2020-12-28 NOTE — Progress Notes (Signed)
  Subjective: 4 Days Post-Op Procedure(s) (LRB): TOTAL HIP ARTHROPLASTY (Right) Patient reports pain as well-controlled.   Patient is well, and has had no acute complaints or problems Diagnosed with pneumonia Friday.  Feeling better today breathing better.  Limited congestion. Fever: no Gastrointestinal: negative for nausea and vomiting.  Patient has had a bowel movement.  Objective: Vital signs in last 24 hours: Temp:  [97.9 F (36.6 C)-98.3 F (36.8 C)] 98.2 F (36.8 C) (03/27 0512) Pulse Rate:  [87-105] 89 (03/27 0512) Resp:  [14-19] 16 (03/27 0512) BP: (131-143)/(51-60) 143/51 (03/27 0512) SpO2:  [95 %-100 %] 97 % (03/27 0512)  Intake/Output from previous day:  Intake/Output Summary (Last 24 hours) at 12/28/2020 0650 Last data filed at 12/28/2020 0509 Gross per 24 hour  Intake 2864.49 ml  Output --  Net 2864.49 ml    Intake/Output this shift: Total I/O In: 2144.5 [I.V.:1794.5; IV Piggyback:350] Out: -   Labs: Recent Labs    12/26/20 0935 12/27/20 0433 12/28/20 0525  HGB 8.9* 8.2* 8.3*   Recent Labs    12/27/20 0433 12/28/20 0525  WBC 11.3* 8.7  RBC 2.85* 2.92*  HCT 24.1* 25.0*  PLT 176 195   Recent Labs    12/27/20 0433  NA 134*  K 3.8  CL 103  CO2 24  BUN 12  CREATININE 0.92  GLUCOSE 78  CALCIUM 7.8*   No results for input(s): LABPT, INR in the last 72 hours.   EXAM General - Patient is Alert, Appropriate and Oriented Extremity - Neurovascular intact Dorsiflexion/Plantar flexion intact Compartment soft Dressing/Incision -dressing clean and dry.  No Hemovac. Motor Function - intact, moving foot and toes well on exam. No drainage over dressing.  Ambulated 200 feet. Cardiovascular- borderline tachycardic rate, normal rhythm Respiratory-improved with no crackles. Gastrointestinal- soft, nontender and active bowel sounds   Assessment/Plan: 4 Days Post-Op Procedure(s) (LRB): TOTAL HIP ARTHROPLASTY (Right) Principal Problem:    Pneumonia Active Problems:   Atopic dermatitis   Chronic leg pain   GERD (gastroesophageal reflux disease)   HTN (hypertension)   Obesity   H/O total hip arthroplasty  Estimated body mass index is 35.07 kg/m as calculated from the following:   Height as of this encounter: 5\' 3"  (1.6 m).   Weight as of this encounter: 89.8 kg. Advance diet Up with therapy  Acute blood loss anemia.  Hemoglobin 8.3.  Continue to follow.  Improved white count to 8.7 this morning.    IV antibiotics will be changed to p.o. antibiotics at discharge.  Awaiting determination of antibiotic therapy to go home with by medicine.  Plan for discharge today.  DVT Prophylaxis - Lovenox, Ted hose and foot pumps Weight-Bearing as tolerated to right leg  Reche Dixon, PA-C [facility] 12/28/2020, 6:50 AM

## 2020-12-28 NOTE — Progress Notes (Signed)
PROGRESS NOTE    Margaret Cannon  AOZ:[id number] DOB: [birth year] DOA: 12/24/2020 PCP: Sharyne Peach, MD  Brief Narrative:  80 y.o. female with medical history significant for hypertension, GERD, truncal obesity, chronic leg pain, history of atopic dermatitis, varicose veins, hyperlipidemia, atrial fibrillation on Eliquis 5 mg twice daily, admitted to orthopedic service for total right hip arthroplasty.  She is status post total right hip arthroplasty on 12/24/2020.  Ms. Tool has been doing well and participating with physical therapy.  Per patient, patient uses stairs with physical therapy today.  On orthopedic service rounds, patient endorsed cough that is productive.  She denies fever.  Orthopedic team ordered portable chest x-ray which showed multilobar pneumonia.  Medicine was consulted for pneumonia.  Patient was started on intravenous Rocephin and azithromycin for community-acquired pneumonia.  Reports improvement in her symptoms since initiation of antibiotics.  White count downtrending.  No fevers over interval.  No oxygen requirement.  WBC cleared on day of discharge   Assessment & Plan:   Principal Problem:   Pneumonia Active Problems:   Atopic dermatitis   Chronic leg pain   GERD (gastroesophageal reflux disease)   HTN (hypertension)   Obesity   H/O total hip arthroplasty  Community-acquired pneumonia Noted on imaging after patient endorsed productive cough Started on azithromycin and Rocephin with improvement in symptoms Cough also improved COVID-19 negative  Recommendations:  DC rocephin Give azithro 500mg  PO x 1 dose today before discharge Recommend augmentin 875/125 x 5 additional days (sent to outpt pharmacy) Recommend anti-tussives prn (tessalon sent to outpt pharmacy) OK for discharge from medicine standpoint   Status post total right hip arthroplasty pain management per primary orthopedic team -Dilaudid, oxycodone, tramadol,  acetaminophen  Hyperlipidemia pravastatin 10 mg daily per primary team  Hypertension Per primary team as resumed triamterene-hydrochlorothiazide 1 tablet daily Reasonable control on above regimen Ensure pain control Consider addition of as needed IV hydralazine if blood pressure remains an issue  History of atrial fibrillation on home Eliquis 5 mg twice daily, this is being held due to recent hip arthroplasty.  Restart when okay with primary team  PT/OT per primary team    DVT prophylaxis: Per primary team Code Status: Full Family Communication: None today Disposition Plan: Per primary team.  Hospitalist on consult.  Plan for discharge today 3/27      Level of care: Med-Surg  Consultants:   Hospitalist  Procedures:   Total right hip arthroplasty  Antimicrobials:   Rocephin  Azithromycin   Subjective: Patient seen and examined.  Still with some cough, but improved.  No fevers.  Leukocytosis resolved  Objective: Vitals:   12/27/20 1929 12/27/20 2344 12/28/20 0512 12/28/20 0801  BP: (!) 131/53 (!) 131/51 (!) 143/51 (!) 163/69  Pulse: 96 87 89 97  Resp: 19 16 16 15   Temp: 98.3 F (36.8 C) 98 F (36.7 C) 98.2 F (36.8 C) 98.7 F (37.1 C)  TempSrc:      SpO2: 100% 100% 97% 99%  Weight:      Height:        Intake/Output Summary (Last 24 hours) at 12/28/2020 0823 Last data filed at 12/28/2020 0509 Gross per 24 hour  Intake 2864.49 ml  Output -  Net 2864.49 ml   Filed Weights   12/24/20 0628  Weight: 89.8 kg    Examination:  General: No apparent distress, patient appears well HEENT: Normocephalic, atraumatic Neck, supple, trachea midline, no tenderness Heart: Regular rate and rhythm, S1/S2 normal, no murmurs Lungs:  left crackles.  Normal WOB.  Room air. Abdomen: Soft, nontender, nondistended, positive bowel sounds Extremities: Normal, atraumatic, no clubbing or cyanosis, normal muscle tone Skin: No rashes or lesions, normal  color Neurologic: Cranial nerves grossly intact, sensation intact, alert and oriented x3 Psychiatric: Normal affect      Data Reviewed: I have personally reviewed following labs and imaging studies  CBC: Recent Labs  Lab 12/26/20 0935 12/27/20 0433 12/28/20 0525  WBC 13.2* 11.3* 8.7  NEUTROABS  --  8.9*  --   HGB 8.9* 8.2* 8.3*  HCT 26.7* 24.1* 25.0*  MCV 84.2 84.6 85.6  PLT 178 176 169   Basic Metabolic Panel: Recent Labs  Lab 12/27/20 0433  NA 134*  K 3.8  CL 103  CO2 24  GLUCOSE 78  BUN 12  CREATININE 0.92  CALCIUM 7.8*   GFR: Estimated Creatinine Clearance: 54.5 mL/min (by C-G formula based on SCr of 0.92 mg/dL). Liver Function Tests: No results for input(s): AST, ALT, ALKPHOS, BILITOT, PROT, ALBUMIN in the last 168 hours. No results for input(s): LIPASE, AMYLASE in the last 168 hours. No results for input(s): AMMONIA in the last 168 hours. Coagulation Profile: No results for input(s): INR, PROTIME in the last 168 hours. Cardiac Enzymes: No results for input(s): CKTOTAL, CKMB, CKMBINDEX, TROPONINI in the last 168 hours. BNP (last 3 results) No results for input(s): PROBNP in the last 8760 hours. HbA1C: No results for input(s): HGBA1C in the last 72 hours. CBG: No results for input(s): GLUCAP in the last 168 hours. Lipid Profile: No results for input(s): CHOL, HDL, LDLCALC, TRIG, CHOLHDL, LDLDIRECT in the last 72 hours. Thyroid Function Tests: No results for input(s): TSH, T4TOTAL, FREET4, T3FREE, THYROIDAB in the last 72 hours. Anemia Panel: No results for input(s): VITAMINB12, FOLATE, FERRITIN, TIBC, IRON, RETICCTPCT in the last 72 hours. Sepsis Labs: No results for input(s): PROCALCITON, LATICACIDVEN in the last 168 hours.  Recent Results (from the past 240 hour(s))  SARS CORONAVIRUS 2 (TAT 6-24 HRS) Nasopharyngeal Nasopharyngeal Swab     Status: None   Collection Time: 12/22/20  1:39 PM   Specimen: Nasopharyngeal Swab  Result Value Ref Range  Status   SARS Coronavirus 2 NEGATIVE NEGATIVE Final    Comment: (NOTE) SARS-CoV-2 target nucleic acids are NOT DETECTED.  The SARS-CoV-2 RNA is generally detectable in upper and lower respiratory specimens during the acute phase of infection. Negative results do not preclude SARS-CoV-2 infection, do not rule out co-infections with other pathogens, and should not be used as the sole basis for treatment or other patient management decisions. Negative results must be combined with clinical observations, patient history, and epidemiological information. The expected result is Negative.  Fact Sheet for Patients: SugarRoll.be  Fact Sheet for Healthcare Providers: https://www.woods-mathews.com/  This test is not yet approved or cleared by the [place] FDA and  has been authorized for detection and/or diagnosis of SARS-CoV-2 by FDA under an Emergency Use Authorization (EUA). This EUA will remain  in effect (meaning this test can be used) for the duration of the COVID-19 declaration under Se ction 564(b)(1) of the Act, 21 U.S.C. section 360bbb-3(b)(1), unless the authorization is terminated or revoked sooner.  Performed at [facility], [place] [address] [place], [place] [address]   SARS CORONAVIRUS 2 (TAT 6-24 HRS) Nasopharyngeal Nasopharyngeal Swab     Status: None   Collection Time: 12/26/20  6:45 PM   Specimen: Nasopharyngeal Swab  Result Value Ref Range Status   SARS Coronavirus 2  NEGATIVE NEGATIVE Final    Comment: (NOTE) SARS-CoV-2 target nucleic acids are NOT DETECTED.  The SARS-CoV-2 RNA is generally detectable in upper and lower respiratory specimens during the acute phase of infection. Negative results do not preclude SARS-CoV-2 infection, do not rule out co-infections with other pathogens, and should not be used as the sole basis for treatment or other patient management decisions. Negative results must be combined with  clinical observations, patient history, and epidemiological information. The expected result is Negative.  Fact Sheet for Patients: SugarRoll.be  Fact Sheet for Healthcare Providers: https://www.woods-mathews.com/  This test is not yet approved or cleared by the [place] FDA and  has been authorized for detection and/or diagnosis of SARS-CoV-2 by FDA under an Emergency Use Authorization (EUA). This EUA will remain  in effect (meaning this test can be used) for the duration of the COVID-19 declaration under Se ction 564(b)(1) of the Act, 21 U.S.C. section 360bbb-3(b)(1), unless the authorization is terminated or revoked sooner.  Performed at [facility], [place] [address], [place], [place] [address]          Radiology Studies: DG Chest 2 View  Result Date: 12/26/2020 CLINICAL DATA:  Lung crackles EXAM: CHEST - 2 VIEW COMPARISON:  03/06/2006 chest radiograph. FINDINGS: Stable cardiomediastinal silhouette with mild cardiomegaly and moderate hiatal hernia. No pneumothorax. Trace bilateral pleural effusions. Extensive patchy consolidation throughout the left lung. Clear right lung. IMPRESSION: Extensive patchy consolidation throughout the left lung, most compatible with multilobar pneumonia. Recommend follow-up chest radiographs to resolution. Mild cardiomegaly.  Trace bilateral pleural effusions. Moderate hiatal hernia. Electronically Signed   By: Ilona Sorrel M.D.   On: 12/26/2020 09:45        Scheduled Meds: . azithromycin  500 mg Oral Once  . calcium-vitamin D  1 tablet Oral Daily  . celecoxib  200 mg Oral BID  . enoxaparin (LOVENOX) injection  30 mg Subcutaneous Q12H  . ferrous sulfate  325 mg Oral BID WC  . magnesium hydroxide  30 mL Oral Daily  . pantoprazole  40 mg Oral BID  . pravastatin  10 mg Oral q1800  . senna-docusate  1 tablet Oral Daily  . triamterene-hydrochlorothiazide  1 tablet Oral BH-q7a   Continuous  Infusions: . sodium chloride 100 mL/hr at 12/28/20 0509  . cefTRIAXone (ROCEPHIN)  IV Stopped (12/27/20 1843)     LOS: 1 day    Time spent: 25 minutes    Sidney Ace, MD [facility] Pager [phone]  If 7PM-7AM, please contact night-coverage 12/28/2020, 8:23 AM

## 2020-12-28 NOTE — Progress Notes (Signed)
Physical Therapy Treatment Patient Details Name: Margaret Cannon MRN: [id number] DOB: [birth year] Today's Date: 12/28/2020    History of Present Illness Pt is a 80 y.o. female here for elective R THA posterior approach. PMH of: GERD, HTN, obesity, glaucoma, and osteopenia.    PT Comments    Pt ready for session.  Completed 1 lap around unit.  To commode to void.  Pt with no further questions or concerns regarding discharge.  Awaiting transport home.  Follow Up Recommendations  Home health PT     Equipment Recommendations  None recommended by PT    Recommendations for Other Services       Precautions / Restrictions Precautions Precautions: Posterior Hip Precaution Booklet Issued: Yes (comment) Restrictions Weight Bearing Restrictions: Yes RLE Weight Bearing: Weight bearing as tolerated    Mobility  Bed Mobility               General bed mobility comments: sitting EOB upon arrival then to recliner after    Transfers Overall transfer level: Modified independent Equipment used: Rolling walker (2 wheeled) Transfers: Sit to/from Stand Sit to Stand: Modified independent (Device/Increase time)            Ambulation/Gait Ambulation/Gait assistance: Supervision Gait Distance (Feet): 200 Feet Assistive device: Rolling walker (2 wheeled) Gait Pattern/deviations: WFL(Within Functional Limits) Gait velocity: decreased   General Gait Details: flexed posture during gait however steady without LOB   Stairs         General stair comments: reported feeling confident and declined review today   Wheelchair Mobility    Modified Rankin (Stroke Patients Only)       Balance Overall balance assessment: Needs assistance Sitting-balance support: Bilateral upper extremity supported Sitting balance-Leahy Scale: Good     Standing balance support: Bilateral upper extremity supported Standing balance-Leahy Scale: Good Standing balance comment: no LOB with  uE support. Was able to perform static activity without UE support without LOB                            Cognition Arousal/Alertness: Awake/alert Behavior During Therapy: WFL for tasks assessed/performed Overall Cognitive Status: Within Functional Limits for tasks assessed                                 General Comments: Pt is A and O x 4      Exercises      General Comments        Pertinent Vitals/Pain Pain Assessment: No/denies pain    Home Living                      Prior Function            PT Goals (current goals can now be found in the care plan section) Progress towards PT goals: Progressing toward goals    Frequency    BID      PT Plan Current plan remains appropriate    Co-evaluation              AM-PAC PT "6 Clicks" Mobility   Outcome Measure  Help needed turning from your back to your side while in a flat bed without using bedrails?: None Help needed moving from lying on your back to sitting on the side of a flat bed without using bedrails?: None Help needed moving to and from a bed to a  chair (including a wheelchair)?: None Help needed standing up from a chair using your arms (e.g., wheelchair or bedside chair)?: None Help needed to walk in hospital room?: A Little Help needed climbing 3-5 steps with a railing? : A Little 6 Click Score: 22    End of Session Equipment Utilized During Treatment:  (none) Activity Tolerance: Patient tolerated treatment well Patient left: in chair;with call bell/phone within reach;with chair alarm set;with family/visitor present Nurse Communication: Mobility status PT Visit Diagnosis: Unsteadiness on feet (R26.81);Other abnormalities of gait and mobility (R26.89);Muscle weakness (generalized) (M62.81)     Time: 6151-8343 PT Time Calculation (min) (ACUTE ONLY): 12 min  Charges:  $Gait Training: 8-22 mins                    Chesley Noon, PTA 12/28/20, 9:40 AM

## 2021-03-23 ENCOUNTER — Other Ambulatory Visit: Payer: Self-pay | Admitting: Family Medicine

## 2021-03-23 DIAGNOSIS — Z780 Asymptomatic menopausal state: Secondary | ICD-10-CM

## 2021-05-18 ENCOUNTER — Encounter: Payer: Self-pay | Admitting: Podiatry

## 2021-05-18 ENCOUNTER — Other Ambulatory Visit: Payer: Self-pay | Admitting: Family Medicine

## 2021-05-18 ENCOUNTER — Other Ambulatory Visit: Payer: Self-pay

## 2021-05-18 ENCOUNTER — Ambulatory Visit: Payer: Medicare Other | Admitting: Podiatry

## 2021-05-18 DIAGNOSIS — M79676 Pain in unspecified toe(s): Secondary | ICD-10-CM | POA: Diagnosis not present

## 2021-05-18 DIAGNOSIS — B351 Tinea unguium: Secondary | ICD-10-CM | POA: Diagnosis not present

## 2021-05-18 DIAGNOSIS — Z1231 Encounter for screening mammogram for malignant neoplasm of breast: Secondary | ICD-10-CM

## 2021-05-18 NOTE — Progress Notes (Signed)
She presents today chief complaint of a painful hallux nail left.  States it has not grown out all the way since we worked on it back last December.  States that the tip of the nail is tender.  Her rest of her nails are long and tender.  Objective: Pulses remain palpable capillary Fill time is immediate no open lesions or wounds.  Toenails are long thick yellow dystrophic Lee mycotic with exception of the hallux nail left which still is growing out.  Assessment: Onychomycosis.  Plan: Debridement of nails.

## 2021-06-02 ENCOUNTER — Ambulatory Visit
Admission: RE | Admit: 2021-06-02 | Discharge: 2021-06-02 | Disposition: A | Discharge status: Home / Self Care | Payer: Medicare Other | Source: Ambulatory Visit | Attending: Family Medicine | Admitting: Family Medicine

## 2021-06-02 ENCOUNTER — Other Ambulatory Visit: Payer: Self-pay

## 2021-06-02 DIAGNOSIS — Z1231 Encounter for screening mammogram for malignant neoplasm of breast: Secondary | ICD-10-CM | POA: Insufficient documentation

## 2021-06-02 DIAGNOSIS — Z780 Asymptomatic menopausal state: Secondary | ICD-10-CM | POA: Insufficient documentation

## 2021-09-14 DIAGNOSIS — Z7901 Long term (current) use of anticoagulants: Secondary | ICD-10-CM | POA: Diagnosis not present

## 2021-09-14 DIAGNOSIS — Z96641 Presence of right artificial hip joint: Secondary | ICD-10-CM | POA: Insufficient documentation

## 2021-09-14 DIAGNOSIS — I4891 Unspecified atrial fibrillation: Secondary | ICD-10-CM | POA: Insufficient documentation

## 2021-09-14 DIAGNOSIS — I10 Essential (primary) hypertension: Secondary | ICD-10-CM | POA: Insufficient documentation

## 2021-09-14 DIAGNOSIS — R42 Dizziness and giddiness: Secondary | ICD-10-CM | POA: Insufficient documentation

## 2021-09-14 DIAGNOSIS — Z79899 Other long term (current) drug therapy: Secondary | ICD-10-CM | POA: Insufficient documentation

## 2021-09-14 DIAGNOSIS — R0789 Other chest pain: Secondary | ICD-10-CM | POA: Diagnosis present

## 2021-09-14 DIAGNOSIS — Z87891 Personal history of nicotine dependence: Secondary | ICD-10-CM | POA: Diagnosis not present

## 2021-09-15 ENCOUNTER — Encounter: Payer: Self-pay | Admitting: *Deleted

## 2021-09-15 ENCOUNTER — Other Ambulatory Visit: Payer: Self-pay

## 2021-09-15 ENCOUNTER — Emergency Department
Admission: EM | Admit: 2021-09-15 | Discharge: 2021-09-15 | Disposition: A | Discharge status: Home / Self Care | Payer: Medicare Other | Attending: Emergency Medicine | Admitting: Emergency Medicine

## 2021-09-15 ENCOUNTER — Emergency Department: Payer: Medicare Other

## 2021-09-15 DIAGNOSIS — R079 Chest pain, unspecified: Secondary | ICD-10-CM

## 2021-09-15 LAB — BASIC METABOLIC PANEL
Anion gap: 8 (ref 5–15)
BUN: 13 mg/dL (ref 8–23)
CO2: 28 mmol/L (ref 22–32)
Calcium: 9.7 mg/dL (ref 8.9–10.3)
Chloride: 100 mmol/L (ref 98–111)
Creatinine, Ser: 1.02 mg/dL — ABNORMAL HIGH (ref 0.44–1.00)
GFR, Estimated: 56 mL/min — ABNORMAL LOW (ref >60)
Glucose, Bld: 106 mg/dL — ABNORMAL HIGH (ref 70–99)
Potassium: 3.3 mmol/L — ABNORMAL LOW (ref 3.5–5.1)
Sodium: 136 mmol/L (ref 135–145)

## 2021-09-15 LAB — CBC
HCT: 38.8 % (ref 36.0–46.0)
Hemoglobin: 12.3 g/dL (ref 12.0–15.0)
MCH: 26.7 pg (ref 26.0–34.0)
MCHC: 31.7 g/dL (ref 30.0–36.0)
MCV: 84.2 fL (ref 80.0–100.0)
Platelets: 270 10*3/uL (ref 150–400)
RBC: 4.61 MIL/uL (ref 3.87–5.11)
RDW: 14.5 % (ref 11.5–15.5)
WBC: 7.8 10*3/uL (ref 4.0–10.5)
nRBC: 0 % (ref 0.0–0.2)

## 2021-09-15 LAB — TROPONIN I (HIGH SENSITIVITY)
Troponin I (High Sensitivity): 8 ng/L (ref <18)
Troponin I (High Sensitivity): 15 ng/L (ref <18)

## 2021-09-15 MED ORDER — ALUMINUM-MAGNESIUM-SIMETHICONE 200-200-20 MG/5ML PO SUSP: 30.0000 mL | Freq: Three times a day (TID) | ORAL | 0 refills | Status: DC

## 2021-09-15 MED ORDER — FAMOTIDINE 20 MG PO TABS: 20.0000 mg | ORAL_TABLET | Freq: Two times a day (BID) | ORAL | 0 refills | Status: DC

## 2021-09-15 NOTE — ED Triage Notes (Signed)
Pt reports heart fluttering and tightening in the chest for a week.  Pt also reports feeling dizzy and weak.  No vomiting.  No sob.  Pt alert  speech clear.

## 2021-09-15 NOTE — ED Provider Notes (Signed)
HPI: Pt is a 80 y.o. female who presents with complaints of chest pain  The patient p/w  chest pain for a week off and on.   ROS: Denies fever  Past Medical History:  Diagnosis Date   GERD (gastroesophageal reflux disease)    Glaucoma    left eye   Heart murmur    Hypertension    Osteopenia    Vitals:   09/15/21 0007 09/15/21 0305  BP: (!) 163/98 (!) 162/60  Pulse: 91 (!) 54  Resp: 20 18  Temp: 97.7 F (36.5 C) 98 F (36.7 C)  SpO2: 95% 97%    Focused Physical Exam: Gen: No acute distress Head: atraumatic, normocephalic Eyes: Extraocular movements grossly intact; conjunctiva clear CV: RRR Lung: No increased WOB, no stridor GI: ND, no obvious masses Neuro: Alert and awake  Medical Decision Making and Plan: Given the patient's initial medical screening exam, the following diagnostic evaluation has been ordered. The patient will be placed in the appropriate treatment space, once one is available, to complete the evaluation and treatment. I have discussed the plan of care with the patient and I have advised the patient that an ED physician or mid-level practitioner will reevaluate their condition after the test results have been received, as the results may give them additional insight into the type of treatment they may need.   Diagnostics: labs   Treatments: none immediately   Vanessa , MD 09/15/21 [phone]

## 2021-09-15 NOTE — ED Provider Notes (Signed)
[facility] Emergency Department Provider Note  ____________________________________________  Time seen: Approximately 8:54 AM  I have reviewed the triage vital signs and the nursing notes.   HISTORY  Chief Complaint Chest Pain    HPI Margaret Cannon is a 80 y.o. female with a past history of hypertension and atrial fibrillation who comes ED complaining of intermittent episodes of chest discomfort described as tightness, mild, nonradiating, no shortness of breath diaphoresis or vomiting, not exertional.  Occurring off and on for the past week.  Sometimes associated with a feeling of dizziness, no syncope.  No aggravating or alleviating factors.  Denies any other acute symptoms.    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   Other specified disorders of veins 06/29/2016   Osteopenia 08/13/2014   Tinea versicolor 01/13/2014   Glaucoma suspect of left eye 01/11/2014   Abdominal pain, other specified site 07/13/2012   Anemia 07/12/2012   Chronic leg pain 07/12/2012   GERD (gastroesophageal reflux disease) 07/12/2012   HTN (hypertension) 07/12/2012     Past Surgical History:  Procedure Laterality Date   CARDIAC CATHETERIZATION     COLONOSCOPY WITH PROPOFOL N/A 07/05/2018   Procedure: COLONOSCOPY WITH PROPOFOL;  Surgeon: Toledo, Benay Pike, MD;  Location: [facility] ENDOSCOPY;  Service: Gastroenterology;  Laterality: N/A;   ESOPHAGOGASTRODUODENOSCOPY (EGD) WITH PROPOFOL N/A 07/05/2018   Procedure:  ESOPHAGOGASTRODUODENOSCOPY (EGD) WITH PROPOFOL;  Surgeon: Toledo, Benay Pike, MD;  Location: [facility] ENDOSCOPY;  Service: Gastroenterology;  Laterality: N/A;   EUS  07/13/2012   Procedure: UPPER ENDOSCOPIC ULTRASOUND (EUS) LINEAR;  Surgeon: Milus Banister, MD;  Location: [facility] ENDOSCOPY;  Service: Endoscopy;  Laterality: N/A;  radial linear     TOTAL HIP ARTHROPLASTY Right 12/24/2020   Procedure: TOTAL HIP ARTHROPLASTY;  Surgeon: Dereck Leep, MD;  Location: [facility] ORS;  Service: Orthopedics;  Laterality: Right;     Prior to Admission medications   Medication Sig Start Date End Date Taking? Authorizing Provider  aluminum-magnesium hydroxide-simethicone (MAALOX) [phone] MG/5ML SUSP Take 30 mLs by mouth 4 (four) times daily -  before meals and at bedtime. 09/15/21  Yes Carrie Mew, MD  famotidine (PEPCID) 20 MG tablet Take 1 tablet (20 mg total) by mouth 2 (two) times daily for 5 days. 09/15/21 09/20/21 Yes Carrie Mew, MD  acetaminophen (TYLENOL) 650 MG CR tablet Take 650 mg by mouth every 8 (eight) hours as needed for pain.    [provider]  benzonatate (TESSALON PERLES) 100 MG capsule Take 1 capsule (100 mg total) by mouth 3 (three) times daily as needed for cough. 12/28/20 12/28/21  Sidney Ace, MD  Calcium Carb-Cholecalciferol (CALCIUM 600 + D PO) Take 1 tablet by mouth daily.    [provider]  celecoxib (CELEBREX) 200 MG capsule Take 1 capsule (200 mg total) by mouth 2 (two) times daily. 12/28/20   Reche Dixon, PA-C  enoxaparin (LOVENOX) 40 MG/0.4ML injection Inject 0.4 mLs (40 mg total) into the skin daily for 14 days. 12/28/20 01/11/21  [place] Parma,  Sherren Mocha, PA-C  Homeopathic Products [facility] RELIEF EX) Apply 1 application topically daily as needed (pain).    [provider]  lovastatin (MEVACOR) 10 MG tablet Take 10 mg by mouth daily. 08/26/20   [provider]  Menthol-Methyl Salicylate (MUSCLE RUB EX) Apply 1 application topically daily as  needed (pain).    [provider]  omeprazole (PRILOSEC OTC) 20 MG tablet Take 20 mg by mouth daily.    [provider]  Polyethyl Glycol-Propyl Glycol (SYSTANE OP) Place 1 drop into both eyes daily as needed (dry eyes).    [provider]  traMADol (ULTRAM) 50 MG tablet Take 1 tablet (50 mg total) by mouth every 4 (four) hours as needed for moderate pain. 12/28/20   Reche Dixon, PA-C  triamcinolone (KENALOG) 0.1 % Apply 1 application topically 2 (two) times daily as needed (dry skin).    [provider]  triamterene-hydrochlorothiazide (DYAZIDE) 37.5-25 MG per capsule Take 1 capsule by mouth every morning.    [provider]     Allergies Patient has no known allergies.   Family History  Problem Relation Age of Onset   Breast cancer Neg Hx     Social History Social History   Tobacco Use   Smoking status: Former    Types: Cigarettes    Quit date: 07/13/2009    Years since quitting: 12.1   Smokeless tobacco: Never  Vaping Use   Vaping Use: Never used  Substance Use Topics   Alcohol use: Yes   Drug use: No    Review of Systems  Constitutional:   No fever or chills.  ENT:   No sore throat. No rhinorrhea. Cardiovascular:   Positive chest pain as above without syncope. Respiratory:   No dyspnea or cough. Gastrointestinal:   Negative for abdominal pain, vomiting and diarrhea.  Musculoskeletal:   Negative for focal pain or swelling All other systems reviewed and are negative except as documented above in ROS and HPI.  ____________________________________________   PHYSICAL EXAM:  VITAL SIGNS: ED Triage Vitals  Enc Vitals Group     BP 09/15/21 0007 (!) 163/98     Pulse Rate 09/15/21 0007 91     Resp 09/15/21 0007 20     Temp 09/15/21 0007 97.7 F (36.5 C)     Temp Source 09/15/21 0007 Oral     SpO2 09/15/21 0007 95 %     Weight 09/15/21 0009 198 lb (89.8 kg)     Height 09/15/21 0009 5\' 3"  (1.6 m)     Head Circumference  --      Peak Flow --      Pain Score 09/15/21 0009 9     Pain Loc --      Pain Edu? --      Excl. in [place]? --     Vital signs reviewed, nursing assessments reviewed.   Constitutional:   Alert and oriented. Non-toxic appearance. Eyes:   Conjunctivae are normal. EOMI. PERRL. ENT      Head:   Normocephalic and atraumatic.      Nose:   Wearing a mask.      Mouth/Throat:   Wearing a mask.      Neck:   No meningismus. Full ROM. Hematological/Lymphatic/Immunilogical:   No cervical lymphadenopathy. Cardiovascular:   RRR, heart rate 65. Symmetric bilateral radial and DP pulses.  No murmurs. Cap refill less than 2 seconds. Respiratory:   Normal respiratory effort without tachypnea/retractions. Breath sounds are clear and equal bilaterally. No  wheezes/rales/rhonchi. Gastrointestinal:   Soft and nontender. Non distended. There is no CVA tenderness.  No rebound, rigidity, or guarding. Genitourinary:   deferred Musculoskeletal:   Normal range of motion in all extremities. No joint effusions.  No lower extremity tenderness.  No edema. Neurologic:   Normal speech and language.  Motor grossly intact. No acute focal neurologic deficits are appreciated.  Skin:    Skin is warm, dry and intact. No rash noted.  No petechiae, purpura, or bullae.  ____________________________________________    LABS (pertinent positives/negatives) (all labs ordered are listed, but only abnormal results are displayed) Labs Reviewed  BASIC METABOLIC PANEL - Abnormal; Notable for the following components:      Result Value   Potassium 3.3 (*)    Glucose, Bld 106 (*)    Creatinine, Ser 1.02 (*)    GFR, Estimated 56 (*)    All other components within normal limits  CBC  TROPONIN I (HIGH SENSITIVITY)  TROPONIN I (HIGH SENSITIVITY)   ____________________________________________   EKG  Interpreted by me Sinus rhythm, rate of 91.  Normal axis and intervals.  Poor R wave progression.  Normal ST segments and T waves.   No acute ischemic changes.  ____________________________________________    RADIOLOGY  DG Chest 2 View  Result Date: 09/15/2021 CLINICAL DATA:  HEART PALPITATIONS AND CHEST DISCOMFORT, DIZZINESS AND WEAKNESS. EXAM: CHEST - 2 VIEW COMPARISON:  PA AND LATERAL 12/26/2020. FINDINGS: The heart size and mediastinal contours are within normal limits. Both lungs are clear aside from linear scarring or atelectasis in the left lower lung field with interval resolution of patchy left lung infiltrates on the previous exam. There is mild osteopenia, degenerative changes and slight dextroscoliosis of the thoracic spine. IMPRESSION: No active cardiopulmonary disease. Electronically Signed   By: Telford Nab M.D.   On: 09/15/2021 00:37    ____________________________________________   PROCEDURES Procedures  ____________________________________________  DIFFERENTIAL DIAGNOSIS   Electrolyte abnormality, anemia, non-STEMI, paroxysmal atrial fibrillation  CLINICAL IMPRESSION / ASSESSMENT AND PLAN / ED COURSE  Medications ordered in the ED: Medications - No data to display  Pertinent labs & imaging results that were available during my care of the patient were reviewed by me and considered in my medical decision making (see chart for details).  Margaret Cannon was evaluated in Emergency Department on 09/15/2021 for the symptoms described in the history of present illness. She was evaluated in the context of the global COVID-19 pandemic, which necessitated consideration that the patient might be at risk for infection with the SARS-CoV-2 virus that causes COVID-19. Institutional protocols and algorithms that pertain to the evaluation of patients at risk for COVID-19 are in a state of rapid change based on information released by regulatory bodies including the CDC and federal and state organizations. These policies and algorithms were followed during the patient's care in the ED.   Patient  presents with nonspecific chest discomfort over the past week.  Not suspicious for ACS itself.  Exam, vitals, EKG, chest x-ray, and labs are all reassuring.   Considering the patient's symptoms, medical history, and physical examination today, I have low suspicion for ACS, PE, TAD, pneumothorax, carditis, mediastinitis, pneumonia, CHF, or sepsis.  Most likely this is paroxysmal atrial fibrillation.  I agreed the patient hospitalization for further cardiac work-up and telemetry monitoring which she declines.  She would like to try additional antacids first and see how that goes.  I encouraged her to follow-up with her cardiologist this week for further assessment of  her symptoms.      ____________________________________________   FINAL CLINICAL IMPRESSION(S) / ED DIAGNOSES    Final diagnoses:  Nonspecific chest pain     ED Discharge Orders          Ordered    famotidine (PEPCID) 20 MG tablet  2 times daily        09/15/21 0854    aluminum-magnesium hydroxide-simethicone (MAALOX) 200-200-20 MG/5ML SUSP  3 times daily before meals & bedtime        09/15/21 0854            Portions of this note were generated with dragon dictation software. Dictation errors may occur despite best attempts at proofreading.    Carrie Mew, MD 09/15/21 [phone]

## 2022-04-26 ENCOUNTER — Other Ambulatory Visit: Payer: Self-pay | Admitting: Family Medicine

## 2022-04-26 DIAGNOSIS — Z1231 Encounter for screening mammogram for malignant neoplasm of breast: Secondary | ICD-10-CM

## 2022-06-03 ENCOUNTER — Ambulatory Visit
Admission: RE | Admit: 2022-06-03 | Discharge: 2022-06-03 | Disposition: A | Discharge status: Home / Self Care | Payer: Medicare Other | Source: Ambulatory Visit | Attending: Family Medicine | Admitting: Family Medicine

## 2022-06-03 DIAGNOSIS — Z1231 Encounter for screening mammogram for malignant neoplasm of breast: Secondary | ICD-10-CM | POA: Insufficient documentation

## 2023-01-24 ENCOUNTER — Ambulatory Visit: Payer: Medicare Other | Admitting: Podiatry

## 2023-02-07 ENCOUNTER — Ambulatory Visit: Payer: Medicare Other | Admitting: Podiatry

## 2023-02-21 ENCOUNTER — Ambulatory Visit: Payer: Medicare Other | Admitting: Podiatry

## 2023-02-21 ENCOUNTER — Encounter: Payer: Self-pay | Admitting: Podiatry

## 2023-02-21 DIAGNOSIS — B351 Tinea unguium: Secondary | ICD-10-CM

## 2023-02-21 DIAGNOSIS — M79676 Pain in unspecified toe(s): Secondary | ICD-10-CM

## 2023-02-21 DIAGNOSIS — D2372 Other benign neoplasm of skin of left lower limb, including hip: Secondary | ICD-10-CM

## 2023-02-21 NOTE — Progress Notes (Unsigned)
She presents today chief complaint of painful great toenails bilaterally had them removed a month they are doing much better for a long time and now started to grow back.  Started to get sore and has painful calluses also.  Objective: Vital signs are stable alert oriented x 3.  Pulses are palpable.  No erythema edema salines drainage or odor.  Nails bilateral demonstrate thick yellow dystrophic onychomycotic nails sharp and rated nail margins.  Porokeratotic lesions benign skin lesions bilateral.  Assessment: Pain in limb secondary to onychomycosis and skin lesions.  Plan: Debridement of benign skin lesions debridement of toenails 1 through 5 bilateral.

## 2023-06-07 ENCOUNTER — Other Ambulatory Visit: Payer: Self-pay | Admitting: Family Medicine

## 2023-06-07 DIAGNOSIS — Z1231 Encounter for screening mammogram for malignant neoplasm of breast: Secondary | ICD-10-CM

## 2023-06-15 ENCOUNTER — Ambulatory Visit
Admission: RE | Admit: 2023-06-15 | Discharge: 2023-06-15 | Disposition: A | Discharge status: Home / Self Care | Payer: Medicare Other | Source: Ambulatory Visit | Attending: Family Medicine | Admitting: Family Medicine

## 2023-06-15 DIAGNOSIS — Z1231 Encounter for screening mammogram for malignant neoplasm of breast: Secondary | ICD-10-CM

## 2023-07-11 ENCOUNTER — Ambulatory Visit: Payer: Medicare Other | Admitting: Podiatry

## 2023-08-22 ENCOUNTER — Ambulatory Visit: Payer: Medicare Other | Admitting: Podiatry

## 2023-08-22 ENCOUNTER — Encounter: Payer: Self-pay | Admitting: Podiatry

## 2023-08-22 DIAGNOSIS — D2372 Other benign neoplasm of skin of left lower limb, including hip: Secondary | ICD-10-CM

## 2023-08-22 DIAGNOSIS — M79676 Pain in unspecified toe(s): Secondary | ICD-10-CM | POA: Diagnosis not present

## 2023-08-22 DIAGNOSIS — B351 Tinea unguium: Secondary | ICD-10-CM | POA: Diagnosis not present

## 2023-08-22 NOTE — Progress Notes (Signed)
She presents today chief complaint of painful elongated toenails and calluses.  Objective: Pulses are palpable benign skin lesions plantar aspect of the midfoot bilaterally.  Toenails are long dystrophic sharply incurvated and painful.  Assessment: Pain in limb secondary to onychomycosis and nail dystrophy.  Benign skin lesions.  Plan: Debridement of benign skin lesions debridement nails 1 through 5 bilateral.  Follow-up in 3 months

## 2023-09-12 ENCOUNTER — Encounter: Payer: Self-pay | Admitting: Ophthalmology

## 2023-09-12 NOTE — Anesthesia Preprocedure Evaluation (Addendum)
Anesthesia Evaluation  Patient identified by MRN, date of birth, ID band Patient awake    Reviewed: Allergy & Precautions, H&P , NPO status , Patient's Chart, lab work & pertinent test results  Airway Mallampati: II  TM Distance: >3 FB     Dental  (+) Edentulous Upper, Edentulous Lower  Lower Dentures, Upper Dentures, Edentulous Upper, Edentulous Lower,:   Pulmonary pneumonia, former smoker          Cardiovascular hypertension, + Peripheral Vascular Disease  + Valvular Problems/Murmurs  Rhythm:Irregular  11-30-22 INTERPRETATION  NORMAL LEFT VENTRICULAR SYSTOLIC FUNCTION WITH AN ESTIMATED EF = >55 %  NORMAL RIGHT VENTRICULAR SYSTOLIC FUNCTION  MILD MITRAL VALVULAR REGURGITATION  NO VALVULAR STENOSIS  GLS: -17.8 %   Heart rate today mildly irregular, occasional PACs, otherwise sinus   Neuro/Psych negative neurological ROS  negative psych ROS   GI/Hepatic negative GI ROS, Neg liver ROS,GERD  ,,  Endo/Other  negative endocrine ROS    Renal/GU Renal diseasenegative Renal ROS  negative genitourinary   Musculoskeletal negative musculoskeletal ROS (+) Arthritis ,    Abdominal   Peds negative pediatric ROS (+)  Hematology negative hematology ROS (+) Blood dyscrasia, anemia   Anesthesia Other Findings Hypertension  GERD (gastroesophageal reflux disease) Glaucoma  Heart murmur Osteopenia  Arthritis Wears dentures  PSVT (paroxysmal supraventricular tachycardia)  Stage 3a chronic kidney disease (CKD)  PVD (peripheral vascular disease)  Severe obesity (BMI 35.0-35.9 with comorbidity) (HCC)  Atrial fibrillation PACs    Reproductive/Obstetrics negative OB ROS                              Anesthesia Physical Anesthesia Plan  ASA: 3  Anesthesia Plan: MAC   Post-op Pain Management:    Induction: Intravenous  PONV Risk Score and Plan:   Airway Management Planned: Natural Airway and  Nasal Cannula  Additional Equipment:   Intra-op Plan:   Post-operative Plan:   Informed Consent: I have reviewed the patients History and Physical, chart, labs and discussed the procedure including the risks, benefits and alternatives for the proposed anesthesia with the patient or authorized representative who has indicated his/her understanding and acceptance.     Dental Advisory Given  Plan Discussed with: Anesthesiologist, CRNA and Surgeon  Anesthesia Plan Comments: (Patient consented for risks of anesthesia including but not limited to:  - adverse reactions to medications - damage to eyes, teeth, lips or other oral mucosa - nerve damage due to positioning  - sore throat or hoarseness - Damage to heart, brain, nerves, lungs, other parts of body or loss of life  Patient voiced understanding and assent.)         Anesthesia Quick Evaluation

## 2023-09-14 ENCOUNTER — Encounter: Payer: Self-pay | Admitting: Ophthalmology

## 2023-09-15 NOTE — Discharge Instructions (Signed)

## 2023-09-20 ENCOUNTER — Encounter: Payer: Self-pay | Admitting: Ophthalmology

## 2023-09-20 ENCOUNTER — Ambulatory Visit: Payer: Medicare Other | Admitting: Anesthesiology

## 2023-09-20 ENCOUNTER — Ambulatory Visit
Admission: RE | Admit: 2023-09-20 | Discharge: 2023-09-20 | Disposition: A | Discharge status: Home / Self Care | Payer: Medicare Other | Attending: Ophthalmology | Admitting: Ophthalmology

## 2023-09-20 ENCOUNTER — Other Ambulatory Visit: Payer: Self-pay

## 2023-09-20 ENCOUNTER — Encounter
Admission: RE | Disposition: A | Discharge status: Home / Self Care | Payer: Self-pay | Source: Home / Self Care | Attending: Ophthalmology

## 2023-09-20 DIAGNOSIS — Z87891 Personal history of nicotine dependence: Secondary | ICD-10-CM | POA: Insufficient documentation

## 2023-09-20 DIAGNOSIS — N1831 Chronic kidney disease, stage 3a: Secondary | ICD-10-CM | POA: Diagnosis not present

## 2023-09-20 DIAGNOSIS — I739 Peripheral vascular disease, unspecified: Secondary | ICD-10-CM | POA: Diagnosis not present

## 2023-09-20 DIAGNOSIS — I129 Hypertensive chronic kidney disease with stage 1 through stage 4 chronic kidney disease, or unspecified chronic kidney disease: Secondary | ICD-10-CM | POA: Diagnosis not present

## 2023-09-20 DIAGNOSIS — H2512 Age-related nuclear cataract, left eye: Secondary | ICD-10-CM | POA: Insufficient documentation

## 2023-09-20 HISTORY — DX: Peripheral vascular disease, unspecified: I73.9

## 2023-09-20 HISTORY — DX: Chronic kidney disease, stage 3a: N18.31

## 2023-09-20 HISTORY — DX: Nonrheumatic mitral (valve) insufficiency: I34.0

## 2023-09-20 HISTORY — DX: Supraventricular tachycardia, unspecified: I47.10

## 2023-09-20 HISTORY — DX: Unspecified atrial fibrillation: I48.91

## 2023-09-20 HISTORY — DX: Unspecified osteoarthritis, unspecified site: M19.90

## 2023-09-20 HISTORY — DX: Morbid (severe) obesity due to excess calories: E66.01

## 2023-09-20 HISTORY — DX: Presence of dental prosthetic device (complete) (partial): Z97.2

## 2023-09-20 HISTORY — PX: CATARACT EXTRACTION W/PHACO: SHX586

## 2023-09-20 SURGERY — PHACOEMULSIFICATION, CATARACT, WITH IOL INSERTION
Anesthesia: Monitor Anesthesia Care | Laterality: Left

## 2023-09-20 MED ORDER — LIDOCAINE HCL (PF) 2 % IJ SOLN
INTRAOCULAR | Status: DC | PRN
  Administered 2023-09-20: 4 mL via INTRAOCULAR

## 2023-09-20 MED ORDER — MIDAZOLAM HCL 2 MG/2ML IJ SOLN
INTRAMUSCULAR | Status: AC
  Filled 2023-09-20: qty 2

## 2023-09-20 MED ORDER — MIDAZOLAM HCL 2 MG/2ML IJ SOLN
INTRAMUSCULAR | Status: DC | PRN
  Administered 2023-09-20: 1 mg via INTRAVENOUS

## 2023-09-20 MED ORDER — TRYPAN BLUE 0.06 % IO SOSY
PREFILLED_SYRINGE | INTRAOCULAR | Status: DC | PRN
  Administered 2023-09-20: .5 mL via INTRAOCULAR

## 2023-09-20 MED ORDER — FENTANYL CITRATE (PF) 100 MCG/2ML IJ SOLN
INTRAMUSCULAR | Status: DC | PRN
  Administered 2023-09-20: 50 ug via INTRAVENOUS

## 2023-09-20 MED ORDER — SIGHTPATH DOSE#1 BSS IO SOLN
INTRAOCULAR | Status: DC | PRN
  Administered 2023-09-20: 74 mL via OPHTHALMIC

## 2023-09-20 MED ORDER — ARMC OPHTHALMIC DILATING DROPS
1.0000 | OPHTHALMIC | Status: DC | PRN
  Administered 2023-09-20 (×3): 1 via OPHTHALMIC

## 2023-09-20 MED ORDER — BRIMONIDINE TARTRATE-TIMOLOL 0.2-0.5 % OP SOLN
OPHTHALMIC | Status: DC | PRN
  Administered 2023-09-20: 1 [drp] via OPHTHALMIC

## 2023-09-20 MED ORDER — FENTANYL CITRATE (PF) 100 MCG/2ML IJ SOLN
INTRAMUSCULAR | Status: AC
  Filled 2023-09-20: qty 2

## 2023-09-20 MED ORDER — SIGHTPATH DOSE#1 BSS IO SOLN
INTRAOCULAR | Status: DC | PRN
  Administered 2023-09-20: 15 mL via INTRAOCULAR

## 2023-09-20 MED ORDER — SIGHTPATH DOSE#1 NA HYALUR & NA CHOND-NA HYALUR IO KIT
PACK | INTRAOCULAR | Status: DC | PRN
  Administered 2023-09-20: 1 via OPHTHALMIC

## 2023-09-20 MED ORDER — TETRACAINE HCL 0.5 % OP SOLN
1.0000 [drp] | OPHTHALMIC | Status: DC | PRN
  Administered 2023-09-20 (×3): 1 [drp] via OPHTHALMIC

## 2023-09-20 MED ORDER — TETRACAINE HCL 0.5 % OP SOLN
OPHTHALMIC | Status: AC
  Filled 2023-09-20: qty 4

## 2023-09-20 MED ORDER — MOXIFLOXACIN HCL 0.5 % OP SOLN
OPHTHALMIC | Status: DC | PRN
  Administered 2023-09-20: .2 mL via OPHTHALMIC

## 2023-09-20 SURGICAL SUPPLY — 12 items
CANNULA ANT/CHMB 27G (MISCELLANEOUS) IMPLANT
CANNULA ANT/CHMB 27GA (MISCELLANEOUS) ×1
CATARACT SUITE SIGHTPATH (MISCELLANEOUS) ×1
DISSECTOR HYDRO NUCLEUS 50X22 (MISCELLANEOUS) ×1 IMPLANT
DRSG TEGADERM 2-3/8X2-3/4 SM (GAUZE/BANDAGES/DRESSINGS) ×1 IMPLANT
FEE CATARACT SUITE SIGHTPATH (MISCELLANEOUS) ×1 IMPLANT
GLOVE SURG SYN 7.5 E (GLOVE) ×1
GLOVE SURG SYN 7.5 PF PI (GLOVE) ×1 IMPLANT
GLOVE SURG SYN 8.5 E (GLOVE) ×1
GLOVE SURG SYN 8.5 PF PI (GLOVE) ×1 IMPLANT
LENS CLAREON WAGON WHEEL 30 (Intraocular Lens) ×1 IMPLANT
LENS IOL CLRN WGN WHL 30.0 (Intraocular Lens) IMPLANT

## 2023-09-20 NOTE — Anesthesia Preprocedure Evaluation (Addendum)
Anesthesia Evaluation  Patient identified by MRN, date of birth, ID band Patient awake    Reviewed: Allergy & Precautions, H&P , NPO status , Patient's Chart, lab work & pertinent test results  Airway Mallampati: II  TM Distance: >3 FB Neck ROM: Full    Dental no notable dental hx. (+) Edentulous Upper, Edentulous Lower   Pulmonary neg pulmonary ROS, pneumonia, former smoker   Pulmonary exam normal breath sounds clear to auscultation       Cardiovascular hypertension, + Peripheral Vascular Disease  negative cardio ROS Normal cardiovascular exam+ Valvular Problems/Murmurs  Rhythm:Regular Rate:Normal  11-30-22 INTERPRETATION  NORMAL LEFT VENTRICULAR SYSTOLIC FUNCTION WITH AN ESTIMATED EF = >55 %  NORMAL RIGHT VENTRICULAR SYSTOLIC FUNCTION  MILD MITRAL VALVULAR REGURGITATION  NO VALVULAR STENOSIS  GLS: -17.8 %    Heart rate on 09-20-23 was mildly irregular, occasional PACs, otherwise sinus    Neuro/Psych negative neurological ROS  negative psych ROS   GI/Hepatic negative GI ROS, Neg liver ROS,GERD  ,,  Endo/Other  negative endocrine ROS    Renal/GU Renal diseasenegative Renal ROS  negative genitourinary   Musculoskeletal negative musculoskeletal ROS (+) Arthritis ,    Abdominal   Peds negative pediatric ROS (+)  Hematology negative hematology ROS (+) Blood dyscrasia, anemia   Anesthesia Other Findings Previous cataract surgery 09-20-23 Dr. Juel Burrow anesthesiologist and Barbette Hair CRNA, had Versed 1 mg IV and fentanyl 50 mcg IV  Hypertension  GERD (gastroesophageal reflux disease) Glaucoma  Heart murmur Osteopenia  Arthritis Wears dentures  PSVT (paroxysmal supraventricular tachycardia) (HCC) Stage 3a chronic kidney disease (CKD) (HCC) PVD (peripheral vascular disease) (HCC) Severe obesity (BMI 35.0-35.9 with comorbidity) (HCC)  Stage 3a chronic kidney disease (HCC) Atrial fibrillation (HCC)  Mild mitral  regurgitation by prior echocardiogram    Reproductive/Obstetrics negative OB ROS                             Anesthesia Physical Anesthesia Plan  ASA: 3  Anesthesia Plan: MAC   Post-op Pain Management:    Induction: Intravenous  PONV Risk Score and Plan:   Airway Management Planned: Natural Airway and Nasal Cannula  Additional Equipment:   Intra-op Plan:   Post-operative Plan:   Informed Consent: I have reviewed the patients History and Physical, chart, labs and discussed the procedure including the risks, benefits and alternatives for the proposed anesthesia with the patient or authorized representative who has indicated his/her understanding and acceptance.     Dental Advisory Given  Plan Discussed with: Anesthesiologist, CRNA and Surgeon  Anesthesia Plan Comments: (Patient consented for risks of anesthesia including but not limited to:  - adverse reactions to medications - damage to eyes, teeth, lips or other oral mucosa - nerve damage due to positioning  - sore throat or hoarseness - Damage to heart, brain, nerves, lungs, other parts of body or loss of life  Patient voiced understanding and assent.)        Anesthesia Quick Evaluation

## 2023-09-20 NOTE — H&P (Signed)
[facility]   Primary Care Physician:  Rayetta Humphrey, MD Ophthalmologist: Dr. Deberah Pelton  Pre-Procedure History & Physical: HPI:  Margaret Cannon is a 82 y.o. female here for cataract surgery.   Past Medical History:  Diagnosis Date   Arthritis    Atrial fibrillation (HCC)    GERD (gastroesophageal reflux disease)    Glaucoma    left eye   Heart murmur    Hypertension    Mild mitral regurgitation by prior echocardiogram    Osteopenia    PSVT (paroxysmal supraventricular tachycardia) (HCC)    PVD (peripheral vascular disease) (HCC)    Severe obesity (BMI 35.0-35.9 with comorbidity) (HCC)    Stage 3a chronic kidney disease (CKD) (HCC)    Stage 3a chronic kidney disease (HCC)    Wears dentures    partial upper and lower    Past Surgical History:  Procedure Laterality Date   CARDIAC CATHETERIZATION     COLONOSCOPY WITH PROPOFOL N/A 07/05/2018   Procedure: COLONOSCOPY WITH PROPOFOL;  Surgeon: Toledo, Boykin Nearing, MD;  Location: [facility] END[facility]COPY;  Service: Gastroenterology;  Laterality: N/A;   ESOPHAGOGASTRODUODENOSCOPY (EGD) WITH PROPOFOL N/A 07/05/2018   Procedure: ESOPHAGOGASTRODUODENOSCOPY (EGD) WITH PROPOFOL;  Surgeon: Toledo, Boykin Nearing, MD;  Location: [facility] END[facility];  Service: Gastroenterology;  Laterality: N/A;   EUS  07/13/2012   Procedure: UPPER ENDOSCOPIC ULTRASOUND (EUS) LINEAR;  Surgeon: Rachael Fee, MD;  Location: [facility];  Service: Endoscopy;  Laterality: N/A;  radial linear     TOTAL HIP ARTHROPLASTY Right 12/24/2020   Procedure: TOTAL HIP ARTHROPLASTY;  Surgeon: Donato Heinz, MD;  Location: [facility] ORS;  Service: Orthopedics;  Laterality: Right;    Prior to Admission medications   Medication Sig Start Date End Date Taking? Authorizing Provider  acetaminophen (TYLENOL) 650 MG CR tablet Take 650 mg by mouth every 8 (eight) hours as needed for pain.   Yes [provider]  aspirin EC 81 MG tablet Take 81 mg by mouth daily. Swallow  whole.   Yes [provider]  Calcium Carb-Cholecalciferol (CALCIUM 600 + D PO) Take 1 tablet by mouth daily.   Yes [provider]  lovastatin (MEVACOR) 10 MG tablet Take 10 mg by mouth daily. 08/26/20  Yes [provider]  metoprolol succinate (TOPROL-XL) 50 MG 24 hr tablet Take 50 mg by mouth daily.   Yes [provider]  omeprazole (PRILOSEC OTC) 20 MG tablet Take 20 mg by mouth daily.   Yes [provider]  triamterene-hydrochlorothiazide (DYAZIDE) 37.5-25 MG per capsule Take 1 capsule by mouth every morning.   Yes [provider]    Allergies as of 08/16/2023   (No Known Allergies)    Family History  Problem Relation Age of Onset   Breast cancer Neg Hx     Social History   Socioeconomic History   Marital status: Married    Spouse name: Not on file   Number of children: Not on file   Years of education: Not on file   Highest education level: Not on file  Occupational History   Not on file  Tobacco Use   Smoking status: Former    Current packs/day: 0.00    Average packs/day: 1 pack/day for 40.8 years (40.8 ttl pk-yrs)    Types: Cigarettes    Start date: 72    Quit date: 07/13/2009    Years since quitting: 14.1   Smokeless tobacco: Never  Vaping Use   Vaping status: Never Used  Substance  and Sexual Activity   Alcohol use: Yes   Drug use: No   Sexual activity: Not on file  Other Topics Concern   Not on file  Social History Narrative   Not on file   Social Drivers of Health   Financial Resource Strain: Not on file  Food Insecurity: Not on file  Transportation Needs: Not on file  Physical Activity: Not on file  Stress: Not on file  Social Connections: Not on file  Intimate Partner Violence: Not on file    Review of Systems: See HPI, otherwise negative ROS  Physical Exam: BP (!) 155/67   Pulse 68   Temp 98.1 F (36.7 C) (Temporal)   Resp 20   Ht 5\' 3"  (1.6 m)   Wt 93.4 kg   SpO2 98%   BMI  36.49 kg/m  General:   Alert, cooperative in NAD Head:  Normocephalic and atraumatic. Respiratory:  Normal work of breathing. Cardiovascular:  RRR  Impression/Plan: Margaret Cannon is here for cataract surgery.  Risks, benefits, limitations, and alternatives regarding cataract surgery have been reviewed with the patient.  Questions have been answered.  All parties agreeable.   Estanislado Pandy, MD  09/20/2023, 11:36 AM

## 2023-09-20 NOTE — Op Note (Signed)
OPERATIVE NOTE  Margaret Cannon [id number] 09/20/2023   PREOPERATIVE DIAGNOSIS: Nuclear sclerotic cataract left eye. H25.12   POSTOPERATIVE DIAGNOSIS: Nuclear sclerotic cataract left eye. H25.12   PROCEDURE:  Phacoemusification with posterior chamber intraocular lens placement of the left eye  Ultrasound time: Procedure(s): CATARACT EXTRACTION PHACO AND INTRAOCULAR LENS PLACEMENT (IOC) LEFT VISION BLUE 8.36 00:49.1 (Left)  LENS:   Implant Name Type Inv. Item Serial No. Manufacturer Lot No. LRB No. Used Action  LENS CLAREON WAGON WHEEL 30 - [id number] Intraocular Lens LENS CLAREON WAGON WHEEL 30 [id number] [facility]  Left 1 Implanted      SURGEON:  Julious Payer. Rolley Sims, MD   ANESTHESIA:  Topical with tetracaine drops, augmented with 1% preservative-free intracameral lidocaine.   COMPLICATIONS:  None.   DESCRIPTION OF PROCEDURE:  The patient was identified in the holding room and transported to the operating room and placed in the supine position under the operating microscope.  The left eye was identified as the operative eye, which was prepped and draped in the usual sterile ophthalmic fashion.   A 1 millimeter clear-corneal paracentesis was made inferotemporally. Preservative-free 1% lidocaine mixed with 1:1,000 bisulfite-free aqueous solution of epinephrine was injected into the anterior chamber. There was a poor red reflex secondary to diffuse cortical changes, so Trypan blue was injected intracamerally to stain the anterior capsule and facilitate safe creation of the capsulorrhexis. The anterior chamber was then filled with Viscoat viscoelastic. A 2.4 millimeter keratome was used to make a clear-corneal incision superotemporally. A curvilinear capsulorrhexis was made with a cystotome and capsulorrhexis forceps. Balanced salt solution was used to hydrodissect and hydrodelineate the nucleus. Phacoemulsification was then used to remove the lens nucleus and epinucleus. The  remaining cortex was then removed using the irrigation and aspiration handpiece. Provisc was then placed into the capsular bag to distend it for lens placement. A +30.00 [id number] intraocular lens was then injected into the capsular bag. The remaining viscoelastic was aspirated.   Wounds were hydrated with balanced salt solution.  The anterior chamber was inflated to a physiologic pressure with balanced salt solution.  No wound leaks were noted. Vigamox was injected intracamerally.  Timolol and Brimonidine drops were applied to the eye.  The patient was taken to the recovery room in stable condition without complications of anesthesia or surgery.  Rolly Pancake Bristow 09/20/2023, 12:25 PM

## 2023-09-20 NOTE — Transfer of Care (Signed)
Immediate Anesthesia Transfer of Care Note  Patient: Margaret Cannon  Procedure(s) Performed: CATARACT EXTRACTION PHACO AND INTRAOCULAR LENS PLACEMENT (IOC) LEFT VISION BLUE 8.36 00:49.1 (Left)  Patient Location: PACU  Anesthesia Type: MAC  Level of Consciousness: awake, alert  and patient cooperative  Airway and Oxygen Therapy: Patient Spontanous Breathing and Patient connected to supplemental oxygen  Post-op Assessment: Post-op Vital signs reviewed, Patient's Cardiovascular Status Stable, Respiratory Function Stable, Patent Airway and No signs of Nausea or vomiting  Post-op Vital Signs: Reviewed and stable  Complications: No notable events documented.

## 2023-09-20 NOTE — Anesthesia Postprocedure Evaluation (Signed)
Anesthesia Post Note  Patient: Margaret Cannon  Procedure(s) Performed: CATARACT EXTRACTION PHACO AND INTRAOCULAR LENS PLACEMENT (IOC) LEFT VISION BLUE 8.36 00:49.1 (Left)  Patient location during evaluation: PACU Anesthesia Type: MAC Level of consciousness: awake and alert Pain management: pain level controlled Vital Signs Assessment: post-procedure vital signs reviewed and stable Respiratory status: spontaneous breathing, nonlabored ventilation, respiratory function stable and patient connected to nasal cannula oxygen Cardiovascular status: stable and blood pressure returned to baseline Postop Assessment: no apparent nausea or vomiting Anesthetic complications: no   No notable events documented.   Last Vitals:  Vitals:   09/20/23 1058 09/20/23 1228  BP: (!) 155/67 (!) 133/56  Pulse: 68   Resp: 20   Temp: 36.7 C 36.5 C  SpO2: 98%     Last Pain:  Vitals:   09/20/23 1237  TempSrc:   PainSc: 0-No pain                 Gurjit Loconte C Davey Limas

## 2023-09-21 ENCOUNTER — Encounter: Payer: Self-pay | Admitting: Ophthalmology

## 2023-09-21 NOTE — Discharge Instructions (Signed)

## 2023-10-03 ENCOUNTER — Ambulatory Visit: Payer: Medicare Other | Admitting: Anesthesiology

## 2023-10-03 ENCOUNTER — Encounter
Admission: RE | Disposition: A | Discharge status: Home / Self Care | Payer: Self-pay | Source: Home / Self Care | Attending: Ophthalmology

## 2023-10-03 ENCOUNTER — Ambulatory Visit
Admission: RE | Admit: 2023-10-03 | Discharge: 2023-10-03 | Disposition: A | Discharge status: Home / Self Care | Payer: Medicare Other | Attending: Ophthalmology | Admitting: Ophthalmology

## 2023-10-03 ENCOUNTER — Encounter: Payer: Self-pay | Admitting: Ophthalmology

## 2023-10-03 ENCOUNTER — Other Ambulatory Visit: Payer: Self-pay

## 2023-10-03 DIAGNOSIS — Z87891 Personal history of nicotine dependence: Secondary | ICD-10-CM | POA: Diagnosis not present

## 2023-10-03 DIAGNOSIS — K219 Gastro-esophageal reflux disease without esophagitis: Secondary | ICD-10-CM | POA: Diagnosis not present

## 2023-10-03 DIAGNOSIS — I340 Nonrheumatic mitral (valve) insufficiency: Secondary | ICD-10-CM | POA: Diagnosis not present

## 2023-10-03 DIAGNOSIS — I129 Hypertensive chronic kidney disease with stage 1 through stage 4 chronic kidney disease, or unspecified chronic kidney disease: Secondary | ICD-10-CM | POA: Diagnosis not present

## 2023-10-03 DIAGNOSIS — I739 Peripheral vascular disease, unspecified: Secondary | ICD-10-CM | POA: Diagnosis not present

## 2023-10-03 DIAGNOSIS — H2511 Age-related nuclear cataract, right eye: Secondary | ICD-10-CM | POA: Insufficient documentation

## 2023-10-03 DIAGNOSIS — N1831 Chronic kidney disease, stage 3a: Secondary | ICD-10-CM | POA: Insufficient documentation

## 2023-10-03 HISTORY — PX: CATARACT EXTRACTION W/PHACO: SHX586

## 2023-10-03 SURGERY — PHACOEMULSIFICATION, CATARACT, WITH IOL INSERTION
Anesthesia: Monitor Anesthesia Care | Laterality: Right

## 2023-10-03 MED ORDER — TETRACAINE HCL 0.5 % OP SOLN
1.0000 [drp] | OPHTHALMIC | Status: DC | PRN
  Administered 2023-10-03 (×3): 1 [drp] via OPHTHALMIC

## 2023-10-03 MED ORDER — SIGHTPATH DOSE#1 BSS IO SOLN
INTRAOCULAR | Status: DC | PRN
  Administered 2023-10-03: 15 mL via INTRAOCULAR

## 2023-10-03 MED ORDER — TETRACAINE HCL 0.5 % OP SOLN
OPHTHALMIC | Status: AC
  Filled 2023-10-03: qty 4

## 2023-10-03 MED ORDER — BRIMONIDINE TARTRATE-TIMOLOL 0.2-0.5 % OP SOLN
OPHTHALMIC | Status: DC | PRN
  Administered 2023-10-03: 1 [drp] via OPHTHALMIC

## 2023-10-03 MED ORDER — MIDAZOLAM HCL 2 MG/2ML IJ SOLN
INTRAMUSCULAR | Status: DC | PRN
  Administered 2023-10-03: 2 mg via INTRAVENOUS

## 2023-10-03 MED ORDER — SIGHTPATH DOSE#1 NA HYALUR & NA CHOND-NA HYALUR IO KIT
PACK | INTRAOCULAR | Status: DC | PRN
  Administered 2023-10-03: 1 via OPHTHALMIC

## 2023-10-03 MED ORDER — ARMC OPHTHALMIC DILATING DROPS
1.0000 | OPHTHALMIC | Status: DC | PRN
  Administered 2023-10-03 (×3): 1 via OPHTHALMIC

## 2023-10-03 MED ORDER — SIGHTPATH DOSE#1 BSS IO SOLN
INTRAOCULAR | Status: DC | PRN
  Administered 2023-10-03: 81 mL via OPHTHALMIC

## 2023-10-03 MED ORDER — ARMC OPHTHALMIC DILATING DROPS
OPHTHALMIC | Status: AC
  Filled 2023-10-03: qty 0.5

## 2023-10-03 MED ORDER — MIDAZOLAM HCL 2 MG/2ML IJ SOLN
INTRAMUSCULAR | Status: AC
Start: 2023-10-03 — End: ?
  Filled 2023-10-03: qty 2

## 2023-10-03 MED ORDER — LIDOCAINE HCL (PF) 2 % IJ SOLN
INTRAOCULAR | Status: DC | PRN
  Administered 2023-10-03: 4 mL via INTRAOCULAR

## 2023-10-03 SURGICAL SUPPLY — 11 items
CATARACT SUITE SIGHTPATH (MISCELLANEOUS) ×1
DISSECTOR HYDRO NUCLEUS 50X22 (MISCELLANEOUS) ×1 IMPLANT
DRSG TEGADERM 2-3/8X2-3/4 SM (GAUZE/BANDAGES/DRESSINGS) ×1 IMPLANT
FEE CATARACT SUITE SIGHTPATH (MISCELLANEOUS) ×1 IMPLANT
GLOVE SURG SYN 7.5 E (GLOVE) ×1
GLOVE SURG SYN 7.5 PF PI (GLOVE) ×1 IMPLANT
GLOVE SURG SYN 8.5 E (GLOVE) ×1
GLOVE SURG SYN 8.5 PF PI (GLOVE) ×1 IMPLANT
KNIFE SIDECUT EYE (MISCELLANEOUS) IMPLANT
LENS CLAREON WAGON WHEEL 30 (Intraocular Lens) ×1 IMPLANT
LENS IOL CLRN WGN WHL 30.0 (Intraocular Lens) IMPLANT

## 2023-10-03 NOTE — Op Note (Signed)
OPERATIVE NOTE  Mischele Hairgrove [id number] 10/03/2023   PREOPERATIVE DIAGNOSIS: Nuclear sclerotic cataract right eye. H25.11   POSTOPERATIVE DIAGNOSIS: Nuclear sclerotic cataract right eye. H25.11   PROCEDURE:  Phacoemusification with posterior chamber intraocular lens placement of the right eye  Ultrasound time: Procedure(s): CATARACT EXTRACTION PHACO AND INTRAOCULAR LENS PLACEMENT (IOC) RIGHT  5.23 00:41.3 (Right)  LENS:   Implant Name Type Inv. Item Serial No. Manufacturer Lot No. LRB No. Used Action  LENS CLAREON WAGON WHEEL 30 - [id number] Intraocular Lens LENS CLAREON WAGON WHEEL 30 [id number] [facility]  Right 1 Implanted      SURGEON:  Julious Payer. Rolley Sims, MD   ANESTHESIA:  Topical with tetracaine drops, augmented with 1% preservative-free intracameral lidocaine.   COMPLICATIONS:  None.   DESCRIPTION OF PROCEDURE:  The patient was identified in the holding room and transported to the operating room and placed in the supine position under the operating microscope.  The right eye was identified as the operative eye, which was prepped and draped in the usual sterile ophthalmic fashion.   A 1 millimeter clear-corneal paracentesis was made superotemporally. Preservative-free 1% lidocaine mixed with 1:1,000 bisulfite-free aqueous solution of epinephrine was injected into the anterior chamber. The anterior chamber was then filled with Viscoat viscoelastic. A 2.4 millimeter keratome was used to make a clear-corneal incision inferotemporally. A curvilinear capsulorrhexis was made with a cystotome and capsulorrhexis forceps. Balanced salt solution was used to hydrodissect and hydrodelineate the nucleus. Phacoemulsification was then used to remove the lens nucleus and epinucleus. The remaining cortex was then removed using the irrigation and aspiration handpiece. Provisc was then placed into the capsular bag to distend it for lens placement. A +30.00 D [id number] intraocular lens was then  injected into the capsular bag. The remaining viscoelastic was aspirated.   Wounds were hydrated with balanced salt solution.  The anterior chamber was inflated to a physiologic pressure with balanced salt solution.  No wound leaks were noted. Vigamox was injected intracamerally.  Timolol and Brimonidine drops were applied to the eye.  The patient was taken to the recovery room in stable condition without complications of anesthesia or surgery.  [employer] 10/03/2023, 10:46 AM

## 2023-10-03 NOTE — Transfer of Care (Signed)
Immediate Anesthesia Transfer of Care Note  Patient: Margaret Cannon  Procedure(s) Performed: CATARACT EXTRACTION PHACO AND INTRAOCULAR LENS PLACEMENT (IOC) RIGHT  5.23 00:41.3 (Right)  Patient Location: PACU  Anesthesia Type: MAC  Level of Consciousness: awake, alert  and patient cooperative  Airway and Oxygen Therapy: Patient Spontanous Breathing and Patient connected to supplemental oxygen  Post-op Assessment: Post-op Vital signs reviewed, Patient's Cardiovascular Status Stable, Respiratory Function Stable, Patent Airway and No signs of Nausea or vomiting  Post-op Vital Signs: Reviewed and stable  Complications: No notable events documented.

## 2023-10-03 NOTE — Anesthesia Postprocedure Evaluation (Signed)
Anesthesia Post Note  Patient: Margaret Cannon  Procedure(s) Performed: CATARACT EXTRACTION PHACO AND INTRAOCULAR LENS PLACEMENT (IOC) RIGHT  5.23 00:41.3 (Right)  Patient location during evaluation: PACU Anesthesia Type: MAC Level of consciousness: awake and alert Pain management: pain level controlled Vital Signs Assessment: post-procedure vital signs reviewed and stable Respiratory status: spontaneous breathing, nonlabored ventilation, respiratory function stable and patient connected to nasal cannula oxygen Cardiovascular status: stable and blood pressure returned to baseline Postop Assessment: no apparent nausea or vomiting Anesthetic complications: no   No notable events documented.   Last Vitals:  Vitals:   10/03/23 1047 10/03/23 1052  BP: 124/62 (!) 126/52  Pulse: 67 72  Resp: 16 19  Temp: (!) 36.1 C (!) 36.1 C  SpO2: 100% 100%    Last Pain:  Vitals:   10/03/23 1052  TempSrc:   PainSc: 0-No pain                 Taneshia Lorence C Bevan Vu

## 2023-10-03 NOTE — H&P (Signed)
[facility]   Primary Care Physician:  Rayetta Humphrey, MD Ophthalmologist: Dr. Deberah Pelton  Pre-Procedure History & Physical: HPI:  Margaret Cannon is a 82 y.o. female here for cataract surgery.   Past Medical History:  Diagnosis Date   Arthritis    Atrial fibrillation (HCC)    GERD (gastroesophageal reflux disease)    Glaucoma    left eye   Heart murmur    Hypertension    Mild mitral regurgitation by prior echocardiogram    Osteopenia    PSVT (paroxysmal supraventricular tachycardia) (HCC)    PVD (peripheral vascular disease) (HCC)    Severe obesity (BMI 35.0-35.9 with comorbidity) (HCC)    Stage 3a chronic kidney disease (CKD) (HCC)    Stage 3a chronic kidney disease (HCC)    Wears dentures    partial upper and lower    Past Surgical History:  Procedure Laterality Date   CARDIAC CATHETERIZATION     CATARACT EXTRACTION W/PHACO Left 09/20/2023   Procedure: CATARACT EXTRACTION PHACO AND INTRAOCULAR LENS PLACEMENT (IOC) LEFT VISION BLUE 8.36 00:49.1;  Surgeon: Estanislado Pandy, MD;  Location: [facility];  Service: Ophthalmology;  Laterality: Left;   COLONOSCOPY WITH PROPOFOL N/A 07/05/2018   Procedure: COLONOSCOPY WITH PROPOFOL;  Surgeon: Toledo, Boykin Nearing, MD;  Location: [facility];  Service: Gastroenterology;  Laterality: N/A;   ESOPHAGOGASTRODUODENOSCOPY (EGD) WITH PROPOFOL N/A 07/05/2018   Procedure: ESOPHAGOGASTRODUODENOSCOPY (EGD) WITH PROPOFOL;  Surgeon: Toledo, Boykin Nearing, MD;  Location: [facility];  Service: Gastroenterology;  Laterality: N/A;   EUS  07/13/2012   Procedure: UPPER ENDOSCOPIC ULTRASOUND (EUS) LINEAR;  Surgeon: Rachael Fee, MD;  Location: [facility];  Service: Endoscopy;  Laterality: N/A;  radial linear     TOTAL HIP ARTHROPLASTY Right 12/24/2020   Procedure: TOTAL HIP ARTHROPLASTY;  Surgeon: Donato Heinz, MD;  Location: [facility];  Service: Orthopedics;  Laterality: Right;    Prior to Admission  medications   Medication Sig Start Date End Date Taking? Authorizing Provider  acetaminophen (TYLENOL) 650 MG CR tablet Take 650 mg by mouth every 8 (eight) hours as needed for pain.   Yes [provider]  aspirin EC 81 MG tablet Take 81 mg by mouth daily. Swallow whole.   Yes [provider]  Calcium Carb-Cholecalciferol (CALCIUM 600 + D PO) Take 1 tablet by mouth daily.   Yes [provider]  lovastatin (MEVACOR) 10 MG tablet Take 10 mg by mouth daily. 08/26/20  Yes [provider]  metoprolol succinate (TOPROL-XL) 50 MG 24 hr tablet Take 50 mg by mouth daily.   Yes [provider]  omeprazole (PRILOSEC OTC) 20 MG tablet Take 20 mg by mouth daily.   Yes [provider]  triamterene-hydrochlorothiazide (DYAZIDE) 37.5-25 MG per capsule Take 1 capsule by mouth every morning.   Yes [provider]    Allergies as of 08/16/2023   (No Known Allergies)    Family History  Problem Relation Age of Onset   Breast cancer Neg Hx     Social History   Socioeconomic History   Marital status: Married    Spouse name: Not on file   Number of children: Not on file   Years of education: Not on file   Highest education level: Not on file  Occupational History   Not on file  Tobacco Use   Smoking status: Former    Current packs/day: 0.00    Average packs/day: 1 pack/day for 40.8 years (40.8 ttl pk-yrs)  Types: Cigarettes    Start date: 72    Quit date: 07/13/2009    Years since quitting: 14.2   Smokeless tobacco: Never  Vaping Use   Vaping status: Never Used  Substance and Sexual Activity   Alcohol use: Yes   Drug use: No   Sexual activity: Not on file  Other Topics Concern   Not on file  Social History Narrative   Not on file   Social Drivers of Health   Financial Resource Strain: Low Risk  (09/26/2023)   Received from [facility]   Overall Financial Resource Strain (CARDIA)    Difficulty of Paying  Living Expenses: Not hard at all  Food Insecurity: No Food Insecurity (09/26/2023)   Received from [facility]   Hunger Vital Sign    Worried About Running Out of Food in the Last Year: Never true    Ran Out of Food in the Last Year: Never true  Transportation Needs: No Transportation Needs (09/26/2023)   [facility] from [facility] - Transportation    In the past 12 months, has lack of transportation kept you from medical appointments or from getting medications?: No    Lack of Transportation (Non-Medical): No  Physical Activity: Not on file  Stress: Not on file  Social Connections: Not on file  Intimate Partner Violence: Not on file    Review of Systems: See HPI, otherwise negative ROS  Physical Exam: BP (!) 147/56   Pulse 68   Temp 98.2 F (36.8 C) (Temporal)   Resp 12   Ht 5\' 3"  (1.6 m)   Wt 93.4 kg   SpO2 100%   BMI 36.46 kg/m  General:   Alert, cooperative in NAD Head:  Normocephalic and atraumatic. Respiratory:  Normal work of breathing. Cardiovascular:  RRR  Impression/Plan: Margaret Cannon is here for cataract surgery.  Risks, benefits, limitations, and alternatives regarding cataract surgery have been reviewed with the patient.  Questions have been answered.  All parties agreeable.   Estanislado Pandy, MD  10/03/2023, 10:06 AM

## 2023-10-04 ENCOUNTER — Encounter: Payer: Self-pay | Admitting: Ophthalmology

## 2023-11-23 ENCOUNTER — Ambulatory Visit: Payer: Medicare Other | Admitting: Podiatry

## 2023-11-30 ENCOUNTER — Encounter: Payer: Self-pay | Admitting: Podiatry

## 2023-11-30 ENCOUNTER — Ambulatory Visit: Payer: Medicare Other | Admitting: Podiatry

## 2023-11-30 DIAGNOSIS — D2372 Other benign neoplasm of skin of left lower limb, including hip: Secondary | ICD-10-CM

## 2023-11-30 DIAGNOSIS — B351 Tinea unguium: Secondary | ICD-10-CM | POA: Diagnosis not present

## 2023-11-30 DIAGNOSIS — M79676 Pain in unspecified toe(s): Secondary | ICD-10-CM

## 2023-11-30 MED ORDER — GABAPENTIN 100 MG PO CAPS: 100.0000 mg | ORAL_CAPSULE | Freq: Every day | ORAL | 3 refills | Status: DC

## 2023-11-30 NOTE — Progress Notes (Signed)
 She presents today complaining of stinging and burning to the medial aspect of her left leg where her left leg has an area that is darker brown than the surrounding tissue.  She also complaining of painful callus plantar aspect of the left foot and painful elongated toenails.  Objective: Vital signs stable alert oriented x 3 there is no erythema edema cellulitis drainage or odor she does have edema to the bilateral legs however this is venous insufficiency from prior exams she does wear compression hose majority of the time.  She has no reproducible burning or tingling to the medial ankle and leg left that she does demonstrate some postinflammatory hyperpigmentation most likely secondary to swelling and edema.  Pain limb secondary to onychomycosis.  Benign skin lesion plantar aspect lateral left foot.  Assessment: Pain in limb secondary to onychomycosis benign skin lesion and venous insufficiency.  Plan: Continue to wear compression hose utilize Benadryl cream during the day and in night we will start her on 100 mg of gabapentin

## 2024-01-10 ENCOUNTER — Other Ambulatory Visit: Payer: Self-pay | Admitting: Family Medicine

## 2024-01-10 DIAGNOSIS — E042 Nontoxic multinodular goiter: Secondary | ICD-10-CM

## 2024-01-10 DIAGNOSIS — E041 Nontoxic single thyroid nodule: Secondary | ICD-10-CM

## 2024-01-18 ENCOUNTER — Ambulatory Visit
Admission: RE | Admit: 2024-01-18 | Discharge: 2024-01-18 | Disposition: A | Discharge status: Home / Self Care | Source: Ambulatory Visit | Attending: Family Medicine | Admitting: Family Medicine

## 2024-01-18 DIAGNOSIS — E042 Nontoxic multinodular goiter: Secondary | ICD-10-CM | POA: Diagnosis present

## 2024-01-18 DIAGNOSIS — E041 Nontoxic single thyroid nodule: Secondary | ICD-10-CM | POA: Insufficient documentation

## 2024-02-16 ENCOUNTER — Other Ambulatory Visit: Payer: Self-pay | Admitting: Otolaryngology

## 2024-02-16 DIAGNOSIS — E041 Nontoxic single thyroid nodule: Secondary | ICD-10-CM

## 2024-02-20 NOTE — Progress Notes (Signed)
 Patient for US  guided FNA RT Inferior Thyroid  Nodule Biopsy on Tues 02/21/24, I called and spoke with the patient on the phone and gave pre-procedure instructions. Pt was made aware to be here at 12:30p and check in at the [facility] registration desk. Pt stated understanding.  Called 02/16/24

## 2024-02-21 ENCOUNTER — Ambulatory Visit
Admission: RE | Admit: 2024-02-21 | Discharge: 2024-02-21 | Disposition: A | Discharge status: Home / Self Care | Source: Ambulatory Visit | Attending: Otolaryngology | Admitting: Otolaryngology

## 2024-02-21 DIAGNOSIS — E041 Nontoxic single thyroid nodule: Secondary | ICD-10-CM

## 2024-02-21 MED ORDER — LIDOCAINE HCL 1 % IJ SOLN
5.0000 mL | Freq: Once | INTRAMUSCULAR | Status: AC
  Administered 2024-02-21: 5 mL via INTRADERMAL

## 2024-02-21 NOTE — Procedures (Signed)
 PROCEDURE SUMMARY:  Using direct ultrasound guidance, 5 passes were made using 25 g needles into the nodule within the right, inferior lobe of the thyroid .   Ultrasound was used to confirm needle placements on all occasions.   EBL = trace  Specimens were sent to Pathology for analysis.  See procedure note under Imaging tab in Epic for full procedure details.  Electronically Signed: Myldred Raju M Maziah Keeling, PA-C 02/21/2024, 3:26 PM

## 2024-02-23 LAB — CYTOLOGY - NON PAP

## 2024-04-20 ENCOUNTER — Other Ambulatory Visit: Payer: Self-pay | Admitting: Family Medicine

## 2024-04-20 DIAGNOSIS — R519 Headache, unspecified: Secondary | ICD-10-CM

## 2024-04-20 DIAGNOSIS — I10 Essential (primary) hypertension: Secondary | ICD-10-CM

## 2024-04-27 ENCOUNTER — Ambulatory Visit
Admission: RE | Admit: 2024-04-27 | Discharge: 2024-04-27 | Disposition: A | Discharge status: Home / Self Care | Source: Ambulatory Visit | Attending: Family Medicine | Admitting: Family Medicine

## 2024-04-27 DIAGNOSIS — R519 Headache, unspecified: Secondary | ICD-10-CM | POA: Diagnosis present

## 2024-04-27 DIAGNOSIS — I10 Essential (primary) hypertension: Secondary | ICD-10-CM | POA: Diagnosis present

## 2024-04-27 MED ORDER — IOHEXOL 350 MG/ML SOLN
75.0000 mL | Freq: Once | INTRAVENOUS | Status: AC | PRN
  Administered 2024-04-27: 75 mL via INTRAVENOUS

## 2024-09-03 ENCOUNTER — Ambulatory Visit: Admitting: Podiatry

## 2024-09-03 DIAGNOSIS — M79676 Pain in unspecified toe(s): Secondary | ICD-10-CM

## 2024-09-03 DIAGNOSIS — B351 Tinea unguium: Secondary | ICD-10-CM

## 2024-09-03 NOTE — Progress Notes (Signed)
 She presents today with chief complaint of painful elongated toenails states that the hallux nail left has been bleeding she does not recall doing any damage to it.  Objective: Pulses are palpable she has edema in her feet.  The hallux left does demonstrate severely brittle nails with splits down the center of the nails and with sub some subungual hematoma present.  Otherwise toenails remaining are thick dystrophic possibly mycotic.  Assessment: Subungual hematoma hallux left.  Pain limb secondary to onychomycosis.  Plan: Debridement of toenails 1 through 5 bilateral.

## 2024-10-24 ENCOUNTER — Other Ambulatory Visit: Payer: Self-pay | Admitting: Podiatry
